# Patient Record
Sex: Male | Born: 1970 | Race: Black or African American | Hispanic: No | Marital: Married | Smoking: Current every day smoker
Health system: Southern US, Community
[De-identification: ages and names within clinical notes are randomized; demographics above are authoritative.]

## PROBLEM LIST (undated history)

## (undated) DIAGNOSIS — E669 Obesity, unspecified: Secondary | ICD-10-CM

## (undated) DIAGNOSIS — R6882 Decreased libido: Secondary | ICD-10-CM

## (undated) DIAGNOSIS — R011 Cardiac murmur, unspecified: Secondary | ICD-10-CM

## (undated) DIAGNOSIS — E119 Type 2 diabetes mellitus without complications: Secondary | ICD-10-CM

## (undated) DIAGNOSIS — G4730 Sleep apnea, unspecified: Secondary | ICD-10-CM

## (undated) HISTORY — DX: Decreased libido: R68.82

## (undated) HISTORY — DX: Cardiac murmur, unspecified: R01.1

## (undated) HISTORY — PX: CARPAL TUNNEL RELEASE: SHX101

## (undated) HISTORY — DX: Sleep apnea, unspecified: G47.30

## (undated) HISTORY — DX: Type 2 diabetes mellitus without complications: E11.9

## (undated) HISTORY — DX: Obesity, unspecified: E66.9

---

## 2005-06-28 ENCOUNTER — Ambulatory Visit: Payer: Self-pay | Admitting: Family Medicine

## 2008-10-14 ENCOUNTER — Ambulatory Visit: Payer: Self-pay | Admitting: Family Medicine

## 2013-04-30 ENCOUNTER — Ambulatory Visit: Payer: Self-pay | Admitting: Podiatry

## 2013-05-14 ENCOUNTER — Ambulatory Visit: Payer: Medicaid Other | Admitting: Podiatry

## 2013-06-25 ENCOUNTER — Ambulatory Visit: Payer: Self-pay | Admitting: Family Medicine

## 2014-09-16 ENCOUNTER — Ambulatory Visit (INDEPENDENT_AMBULATORY_CARE_PROVIDER_SITE_OTHER): Payer: BLUE CROSS/BLUE SHIELD | Admitting: Family Medicine

## 2014-09-16 ENCOUNTER — Encounter: Payer: Self-pay | Admitting: Family Medicine

## 2014-09-16 VITALS — BP 118/78 | HR 75 | Temp 98.6°F | Resp 16 | Ht 65.0 in | Wt 275.6 lb

## 2014-09-16 DIAGNOSIS — I10 Essential (primary) hypertension: Secondary | ICD-10-CM

## 2014-09-16 DIAGNOSIS — R51 Headache: Secondary | ICD-10-CM | POA: Diagnosis not present

## 2014-09-16 DIAGNOSIS — Z23 Encounter for immunization: Secondary | ICD-10-CM

## 2014-09-16 DIAGNOSIS — Z720 Tobacco use: Secondary | ICD-10-CM | POA: Diagnosis not present

## 2014-09-16 DIAGNOSIS — E669 Obesity, unspecified: Secondary | ICD-10-CM | POA: Diagnosis not present

## 2014-09-16 DIAGNOSIS — G5600 Carpal tunnel syndrome, unspecified upper limb: Secondary | ICD-10-CM

## 2014-09-16 DIAGNOSIS — R079 Chest pain, unspecified: Secondary | ICD-10-CM | POA: Diagnosis not present

## 2014-09-16 DIAGNOSIS — K219 Gastro-esophageal reflux disease without esophagitis: Secondary | ICD-10-CM

## 2014-09-16 DIAGNOSIS — R519 Headache, unspecified: Secondary | ICD-10-CM

## 2014-09-16 MED ORDER — OMEPRAZOLE 20 MG PO CPDR: 20.0000 mg | DELAYED_RELEASE_CAPSULE | Freq: Every day | ORAL | Status: DC

## 2014-09-16 NOTE — Progress Notes (Signed)
Name: Alvin Green   MRN: [id number]    DOB: [birth year]   Date:09/16/2014       Progress Note  Subjective  Chief Complaint  Chief Complaint  Patient presents with  . Chest Pain    sharpe chest pains for 2 days    HPI  Chest pain  Patient has a history of chest pain for the past 2 days. He describes it as being in both the left and right anterior chest area occasionally radiating to the neck to the left arm. He also has a left hand tingling which may be related to carpal tunnel syndrome as outlined below.  The patient is still an increased amount of stress of recent. This pain is described as a pressure sensation. There is also a bit of stress which is related to his work situation haven't let some employees ago that seems to be related to his onset of symptomatology.  Hypertension subjective patient has a [place]-standing history of hypertension and on the past and has been on a regimen of antihypertensive to given about a few months ago. Risk factors include hypertension as well as smoking obesity and relatively sedentary lifestyle  Carpal tunnel syndrome.  Complaining of tingling in the left hand and wrist which is often worse at night he has sometimes has to shake his hand to get it improved.  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 He recently has had to recommend several employees and let some go this is constant and more stress and more chest discomfort when he thinks about it.    Past Medical History  Diagnosis Date  . Decreased libido   . Obesity     Social History  Substance Use Topics  . Smoking status: Current Every Day Smoker  . Smokeless tobacco: Not on file  . Alcohol Use: 0.0 oz/week    0 Standard drinks or equivalent per week     Current outpatient prescriptions:  .  omeprazole (PRILOSEC) 20 MG capsule, Take 1 capsule (20 mg total) by mouth daily., Disp: 30 capsule, Rfl: 3  No Known Allergies  Review of  Systems  Constitutional: Negative for fever, chills and weight loss.  HENT: Negative for congestion, hearing loss, sore throat and tinnitus.   Eyes: Negative for blurred vision, double vision and redness.  Respiratory: Negative for cough, hemoptysis and shortness of breath.   Cardiovascular: Positive for chest pain. Negative for palpitations, orthopnea, claudication and leg swelling.  Gastrointestinal: Negative for heartburn, nausea, vomiting, diarrhea, constipation and blood in stool.  Genitourinary: Negative for dysuria, urgency, frequency and hematuria.  Musculoskeletal: Negative for myalgias, back pain, joint pain, falls and neck pain.  Skin: Negative for itching.  Neurological: Positive for tingling. Negative for dizziness, tremors, focal weakness, seizures, loss of consciousness, weakness and headaches.  Endo/Heme/Allergies: Does not bruise/bleed easily.  Psychiatric/Behavioral: Negative for depression and substance abuse. The patient is nervous/anxious. The patient does not have insomnia.      Objective  Filed Vitals:   09/16/14 0905  BP: 118/78  Pulse: 75  Temp: 98.6 F (37 C)  Resp: 16  Height:  (1.651 m)  Weight: 275 lb 9 oz (124.994 kg)  SpO2: 97%     Physical Exam  Constitutional: He is oriented to person, place, and time and well-developed, well-nourished, and in no distress.  HENT:  Head: Normocephalic.  Eyes: EOM are normal. Pupils are equal, round, and reactive to light.  Neck: Normal range of  motion. Neck supple. No thyromegaly present.  Cardiovascular: Normal rate, regular rhythm and normal heart sounds.   No murmur heard. Pulmonary/Chest: Effort normal and breath sounds normal. No respiratory distress. He has no wheezes.  Abdominal: Soft. Bowel sounds are normal.  Musculoskeletal: Normal range of motion. He exhibits no edema.  Lymphadenopathy:    He has no cervical adenopathy.  Neurological: He is alert and oriented to person, place, and time. No  cranial nerve deficit. Gait normal. Coordination normal.  Positive Phalen's and Tinel's test the left wrist  Skin: Skin is warm and dry. No rash noted.  Psychiatric: Affect and judgment normal.  Vitals reviewed.     Assessment & Plan  1. Chest pain, unspecified chest pain type Rule out cardiac origin in view of risk factors which include hypertension obesity and tobacco abuse - EKG 12-Lead - Comprehensive Metabolic Panel (CMET) - Lipid Profile - TSH - Ambulatory referral to Cardiology  2. Essential hypertension doing well off medication. Reassess in 1 month   3. Obesity Encourage diet and exercise  4. Headache, unspecified headache type Reassess in 1 month to see this related to his hypertension  5. Carpal tunnel syndrome, unspecified laterality Wrist splint and check glucose - Wrist splint - POCT Glucose (CBG)  6. Tobacco abuse Encourage discontinuation  7. Gastroesophageal reflux disease without esophagitis Trial of omeprazole - omeprazole (PRILOSEC) 20 MG capsule; Take 1 capsule (20 mg total) by mouth daily.  Dispense: 30 capsule; Refill: 3

## 2014-09-28 ENCOUNTER — Encounter: Payer: Self-pay | Admitting: Cardiovascular Disease

## 2014-09-28 ENCOUNTER — Ambulatory Visit: Payer: Self-pay | Admitting: Cardiovascular Disease

## 2014-09-28 ENCOUNTER — Ambulatory Visit (INDEPENDENT_AMBULATORY_CARE_PROVIDER_SITE_OTHER): Payer: BLUE CROSS/BLUE SHIELD | Admitting: Cardiovascular Disease

## 2014-09-28 DIAGNOSIS — R0602 Shortness of breath: Secondary | ICD-10-CM | POA: Diagnosis not present

## 2014-09-28 DIAGNOSIS — R079 Chest pain, unspecified: Secondary | ICD-10-CM | POA: Insufficient documentation

## 2014-09-28 MED ORDER — ASPIRIN EC 81 MG PO TBEC: 81.0000 mg | DELAYED_RELEASE_TABLET | Freq: Every day | ORAL | Status: DC

## 2014-09-28 MED ORDER — SILDENAFIL CITRATE 50 MG PO TABS: 50.0000 mg | ORAL_TABLET | Freq: Every day | ORAL | Status: DC | PRN

## 2014-09-28 NOTE — Assessment & Plan Note (Signed)
He has a significant component of shortness of breath and thus I requested an echocardiogram to evaluate diastolic function and pulmonary pressure.

## 2014-09-28 NOTE — Progress Notes (Signed)
Primary care physician: Dr. Thana Ates  HPI  This is a pleasant 44 year old African-American male who was referred for evaluation of exertional chest pain and shortness of breath. He has no previous cardiac history. He reports history of elevated blood pressure in the past but currently not requiring medications. He is morbidly obese and smokes one third of a pack daily. He drinks occasional alcohol and denies any recreational drug use. He played football in high school. He has no family history of coronary artery disease. He works as a Production designer, theatre/television/film at [employer]. Over the last few months, he has experienced intermittent episodes of substernal chest tightness with extreme shortness of breath. This started in the summer and has continued. The symptoms have been stable over the last few months. The tightness lasts for a few minutes or until he rests. The chest tightness can also be triggered by stress or if he gets upset.   No Known Allergies   Current Outpatient Prescriptions on File Prior to Visit  Medication Sig Dispense Refill  . omeprazole (PRILOSEC) 20 MG capsule Take 1 capsule (20 mg total) by mouth daily. 30 capsule 3   No current facility-administered medications on file prior to visit.     Past Medical History  Diagnosis Date  . Decreased libido   . Obesity   . Heart murmur      Past Surgical History  Procedure Laterality Date  . Carpal tunnel release       Family History  Problem Relation Age of Onset  . Hypertension Mother   . Sleep disorder Father      Social History   Social History  . Marital Status: Unknown    Spouse Name: N/A  . Number of Children: N/A  . Years of Education: N/A   Occupational History  . Not on file.   Social History Main Topics  . Smoking status: Current Every Day Smoker -- 0.50 packs/day for 9 years    Types: Cigarettes  . Smokeless tobacco: Not on file  . Alcohol Use: 0.0 oz/week    0 Standard drinks or equivalent per week  . Drug  Use: No  . Sexual Activity: Not on file   Other Topics Concern  . Not on file   Social History Narrative     ROS A 10 point review of system was performed. It is negative other than that mentioned in the history of present illness.   PHYSICAL EXAM   BP 120/60 mmHg  Pulse 61  Ht  (1.676 m)  Wt 276 lb (125.193 kg)  BMI 44.57 kg/m2 Constitutional: He is oriented to person, place, and time. He appears well-developed and well-nourished. No distress.  HENT: No nasal discharge.  Head: Normocephalic and atraumatic.  Eyes: Pupils are equal and round.  No discharge. Neck: Normal range of motion. Neck supple. No JVD present. No thyromegaly present.  Cardiovascular: Normal rate, regular rhythm, normal heart sounds. Exam reveals no gallop and no friction rub. No murmur heard.  Pulmonary/Chest: Effort normal and breath sounds normal. No stridor. No respiratory distress. He has no wheezes. He has no rales. He exhibits no tenderness.  Abdominal: Soft. Bowel sounds are normal. He exhibits no distension. There is no tenderness. There is no rebound and no guarding.  Musculoskeletal: Normal range of motion. He exhibits no edema and no tenderness.  Neurological: He is alert and oriented to person, place, and time. Coordination normal.  Skin: Skin is warm and dry. No rash noted. He is not diaphoretic.  No erythema. No pallor.  Psychiatric: He has a normal mood and affect. His behavior is normal. Judgment and thought content normal.       ZOX:WRUEAV sinus rhythm P:QRS - 1:1, Abnormal P axis, H Rate 61 -  Nonspecific inferior T-abnormality.   ABNORMAL    ASSESSMENT AND PLAN

## 2014-09-28 NOTE — Patient Instructions (Addendum)
Medication Instructions:  Your physician has recommended you make the following change in your medication:  START taking aspirin  once per day   Labwork: none  Testing/Procedures: Your physician has requested that you have an echocardiogram. Echocardiography is a painless test that uses sound waves to create images of your heart. It provides your doctor with information about the size and shape of your heart and how well your heart's chambers and valves are working. This procedure takes approximately one hour. There are no restrictions for this procedure.  Your physician has requested that you have a lexiscan myoview. For further information please visit [email or web address] Please follow instruction sheet, as given.  [facility] MYOVIEW  Your caregiver has ordered a Stress Test with nuclear imaging. The purpose of this test is to evaluate the blood supply to your heart muscle. This procedure is referred to as a "Non-Invasive Stress Test." This is because other than having an IV started in your vein, nothing is inserted or "invades" your body. Cardiac stress tests are done to find areas of poor blood flow to the heart by determining the extent of coronary artery disease (CAD). Some patients exercise on a treadmill, which naturally increases the blood flow to your heart, while others who are  unable to walk on a treadmill due to physical limitations have a pharmacologic/chemical stress agent called Lexiscan . This medicine will mimic walking on a treadmill by temporarily increasing your coronary blood flow.   Please note: these test may take anywhere between 2-4 hours to complete  PLEASE REPORT TO [facility] ENTRANCE  THE VOLUNTEERS AT THE FIRST DESK WILL DIRECT YOU WHERE TO GO  Date of Procedure: Friday, September 30, 8:30am  Arrival Time for Procedure: 8:00am  Instructions regarding medication:   You may take your morning medications with a sip of water.    PLEASE NOTIFY THE  OFFICE AT LEAST 24 HOURS IN ADVANCE IF YOU ARE UNABLE TO KEEP YOUR APPOINTMENT.  [phone] AND  PLEASE NOTIFY NUCLEAR MEDICINE AT [facility] AT LEAST 24 HOURS IN ADVANCE IF YOU ARE UNABLE TO KEEP YOUR APPOINTMENT. [phone]  How to prepare for your Myoview test:  1. Do not eat or drink after midnight 2. No caffeine for 24 hours prior to test 3. No smoking 24 hours prior to test. 4. Your medication may be taken with water.  If your doctor stopped a medication because of this test, do not take that medication. 5. Ladies, please do not wear dresses.  Skirts or pants are appropriate. Please wear a short sleeve shirt. 6. No perfume, cologne or lotion. 7. Wear comfortable walking shoes. No heels!             Follow-Up: Your physician recommends that you schedule a follow-up appointment with Dr. Kirke Corin after tests  Any Other Special Instructions Will Be Listed Below (If Applicable).  Echocardiogram An echocardiogram, or echocardiography, uses sound waves (ultrasound) to produce an image of your heart. The echocardiogram is simple, painless, obtained within a short period of time, and offers valuable information to your health care Lauria Depoy. The images from an echocardiogram can provide information such as:  Evidence of coronary artery disease (CAD).  Heart size.  Heart muscle function.  Heart valve function.  Aneurysm detection.  Evidence of a past heart attack.  Fluid buildup around the heart.  Heart muscle thickening.  Assess heart valve function. LET [facility] CARE Kelon Easom KNOW ABOUT:  Any allergies you have.  All medicines you are taking, including vitamins,  herbs, eye drops, creams, and over-the-counter medicines.  Previous problems you or members of your family have had with the use of anesthetics.  Any blood disorders you have.  Previous surgeries you have had.  Medical conditions you have.  Possibility of pregnancy, if this applies. BEFORE THE  PROCEDURE  No special preparation is needed. Eat and drink normally.  PROCEDURE  8. In order to produce an image of your heart, gel will be applied to your chest and a wand-like tool (transducer) will be moved over your chest. The gel will help transmit the sound waves from the transducer. The sound waves will harmlessly bounce off your heart to allow the heart images to be captured in real-time motion. These images will then be recorded. 9. You may need an IV to receive a medicine that improves the quality of the pictures. AFTER THE PROCEDURE You may return to your normal schedule including diet, activities, and medicines, unless your health care Cristella Stiver tells you otherwise. Document Released: 12/17/1999 Document Revised: 05/05/2013 Document Reviewed: 08/26/2012 [facility] Patient Information 2015 [place], [place]. This information is not intended to replace advice given to you by your health care Rilyn Upshaw. Make sure you discuss any questions you have with your health care Reubin Bushnell.

## 2014-09-28 NOTE — Assessment & Plan Note (Signed)
The patient's symptoms are suggestive of class III angina with reported exertional chest tightness. However, there is also possibility of physical deconditioning. His EKG shows inverted T waves in the inferior leads suggestive of possible ischemia. I asked him to start aspirin 81 mg daily. I requested a treadmill nuclear stress test. Given that his baseline ECG is abnormal, I do not think a treadmill stress test alone is sufficient. I asked him to limit his exertional physical activities until after cardiac workup.

## 2014-09-28 NOTE — Assessment & Plan Note (Signed)
I had a prolonged discussion with the patient about the importance of healthy lifestyle changes. If his cardiac workup is unremarkable, then I will recommend an exercise program, healthy diet and weight loss.

## 2014-10-01 ENCOUNTER — Telehealth: Payer: Self-pay

## 2014-10-01 NOTE — Telephone Encounter (Signed)
Reviewed lexi myoview instructions w/pt. Confirmed echo and f/u appt  Pt verbalized understanding with no further questions.

## 2014-10-02 ENCOUNTER — Ambulatory Visit
Admission: RE | Admit: 2014-10-02 | Discharge: 2014-10-02 | Disposition: A | Discharge status: Home / Self Care | Payer: BLUE CROSS/BLUE SHIELD | Source: Ambulatory Visit | Attending: Cardiovascular Disease | Admitting: Cardiovascular Disease

## 2014-10-02 DIAGNOSIS — R079 Chest pain, unspecified: Secondary | ICD-10-CM | POA: Diagnosis not present

## 2014-10-02 LAB — NM MYOCAR MULTI W/SPECT W/WALL MOTION / EF
CHL CUP NUCLEAR SDS: 0
CHL CUP NUCLEAR SSS: 0
CSEPHR: 85 %
CSEPPHR: 151 {beats}/min
LV sys vol: 46 mL
LVDIAVOL: 105 mL
Rest HR: 90 {beats}/min
SRS: 0
TID: 0.78

## 2014-10-02 MED ORDER — TECHNETIUM TC 99M SESTAMIBI - CARDIOLITE
13.0000 | Freq: Once | INTRAVENOUS | Status: AC | PRN
  Administered 2014-10-02: 09:00:00 13.396 via INTRAVENOUS

## 2014-10-02 MED ORDER — TECHNETIUM TC 99M SESTAMIBI - CARDIOLITE
32.3000 | Freq: Once | INTRAVENOUS | Status: AC | PRN
  Administered 2014-10-02: 10:00:00 32.3 via INTRAVENOUS

## 2014-10-06 ENCOUNTER — Other Ambulatory Visit: Payer: Self-pay

## 2014-10-06 ENCOUNTER — Ambulatory Visit (INDEPENDENT_AMBULATORY_CARE_PROVIDER_SITE_OTHER): Payer: BLUE CROSS/BLUE SHIELD

## 2014-10-06 DIAGNOSIS — R0602 Shortness of breath: Secondary | ICD-10-CM

## 2014-10-20 ENCOUNTER — Ambulatory Visit: Payer: BLUE CROSS/BLUE SHIELD | Admitting: Family Medicine

## 2014-10-20 ENCOUNTER — Encounter: Payer: BLUE CROSS/BLUE SHIELD | Admitting: Family Medicine

## 2014-10-22 ENCOUNTER — Ambulatory Visit: Payer: BLUE CROSS/BLUE SHIELD | Admitting: Family Medicine

## 2014-11-13 ENCOUNTER — Ambulatory Visit: Payer: BLUE CROSS/BLUE SHIELD | Admitting: Cardiovascular Disease

## 2014-11-17 ENCOUNTER — Encounter: Payer: BLUE CROSS/BLUE SHIELD | Admitting: Family Medicine

## 2014-11-20 ENCOUNTER — Ambulatory Visit: Payer: BLUE CROSS/BLUE SHIELD | Admitting: Cardiovascular Disease

## 2015-01-28 ENCOUNTER — Ambulatory Visit: Payer: BLUE CROSS/BLUE SHIELD | Admitting: Cardiovascular Disease

## 2015-09-30 ENCOUNTER — Emergency Department
Admission: EM | Admit: 2015-09-30 | Discharge: 2015-09-30 | Disposition: A | Discharge status: Home / Self Care | Payer: BLUE CROSS/BLUE SHIELD | Attending: Emergency Medicine | Admitting: Emergency Medicine

## 2015-09-30 ENCOUNTER — Emergency Department: Payer: BLUE CROSS/BLUE SHIELD

## 2015-09-30 ENCOUNTER — Encounter: Payer: Self-pay | Admitting: *Deleted

## 2015-09-30 DIAGNOSIS — Z79899 Other long term (current) drug therapy: Secondary | ICD-10-CM | POA: Diagnosis not present

## 2015-09-30 DIAGNOSIS — F17210 Nicotine dependence, cigarettes, uncomplicated: Secondary | ICD-10-CM | POA: Insufficient documentation

## 2015-09-30 DIAGNOSIS — M1711 Unilateral primary osteoarthritis, right knee: Secondary | ICD-10-CM | POA: Diagnosis not present

## 2015-09-30 DIAGNOSIS — M25561 Pain in right knee: Secondary | ICD-10-CM | POA: Diagnosis present

## 2015-09-30 DIAGNOSIS — Z7982 Long term (current) use of aspirin: Secondary | ICD-10-CM | POA: Diagnosis not present

## 2015-09-30 MED ORDER — MELOXICAM 15 MG PO TABS: 15.0000 mg | ORAL_TABLET | Freq: Every day | ORAL | 2 refills | Status: DC

## 2015-09-30 NOTE — Discharge Instructions (Signed)
Begin taking meloxicam 15 mg once daily. Be sure to take this medication with food. If any continued problems with her knee follow-up with your primary care doctor or Dr. Hyacinth MeekerMiller who is the orthopedist on call today.

## 2015-09-30 NOTE — ED Triage Notes (Signed)
Pt complains of right knee pain, pt denies injuring knee, pt denies any other symptoms

## 2015-09-30 NOTE — ED Notes (Signed)
Patient discharged home.

## 2015-09-30 NOTE — ED Provider Notes (Signed)
[facility] Emergency Department Provider Note   ____________________________________________   First MD Initiated Contact with Patient 09/30/15 1008     (approximate)  I have reviewed the triage vital signs and the nursing notes.   HISTORY  Chief Complaint Knee Pain    HPI Alvin Green is a 45 y.o. male is here complaining of right knee pain. Pain is anterior medial aspect of his right knee. Pain is worse with movement and standing. Patient is unaware of any injury to his knee. He states that his knees been hurting for the last week and does not seem to be improving. The patient has not consistently taken any over-the-counter medication for his pain. He denies any previous problems with his knee. Currently he rates his knee pain is 8 out of 10.   Past Medical History:  Diagnosis Date  . Decreased libido   . Heart murmur   . Obesity     Patient Active Problem List   Diagnosis Date Noted  . Pain in the chest 09/28/2014  . SOB (shortness of breath) 09/28/2014  . Morbid obesity (HCC) 09/28/2014    Past Surgical History:  Procedure Laterality Date  . CARPAL TUNNEL RELEASE    . CARPAL TUNNEL RELEASE Right    approximately 10 years ago    Prior to Admission medications   Medication Sig Start Date End Date Taking? Authorizing Provider  aspirin EC 81 MG tablet Take 1 tablet (81 mg total) by mouth daily. 09/28/14   Iran Ouch, MD  meloxicam (MOBIC) 15 MG tablet Take 1 tablet (15 mg total) by mouth daily. 09/30/15 09/29/16  Tommi Rumps, PA-C  omeprazole (PRILOSEC) 20 MG capsule Take 1 capsule (20 mg total) by mouth daily. 09/16/14   Dennison Mascot, MD    Allergies Review of patient's allergies indicates no known allergies.  Family History  Problem Relation Age of Onset  . Hypertension Mother   . Sleep disorder Father     Social History Social History  Substance Use Topics  . Smoking status: Current Every Day Smoker   Packs/day: 0.50    Years: 9.00    Types: Cigarettes  . Smokeless tobacco: Not on file  . Alcohol use 0.0 oz/week    Review of Systems Constitutional: No fever/chills Cardiovascular: Denies chest pain. Respiratory: Denies shortness of breath. Gastrointestinal:   No nausea, no vomiting.  Musculoskeletal: Negative for back pain. Positive right knee pain. Skin: Negative for rash. Neurological: Negative for headaches, focal weakness or numbness.  10-point ROS otherwise negative.  ____________________________________________   PHYSICAL EXAM:  VITAL SIGNS: ED Triage Vitals  Enc Vitals Group     BP 09/30/15 1001 139/69     Pulse Rate 09/30/15 1001 90     Resp 09/30/15 1001 20     Temp 09/30/15 1001 98.5 F (36.9 C)     Temp Source 09/30/15 1001 Oral     SpO2 09/30/15 1001 100 %     Weight 09/30/15 1001 278 lb (126.1 kg)     Height 09/30/15 1001 5\' 6"  (1.676 m)     Head Circumference --      Peak Flow --      Pain Score 09/30/15 1002 8     Pain Loc --      Pain Edu? --      Excl. in GC? --     Constitutional: Alert and oriented. Well appearing and in no acute distress. Eyes: Conjunctivae are normal. PERRL. EOMI. Head:  Atraumatic. Nose: No congestion/rhinnorhea. Neck: No stridor.   Cardiovascular: Normal rate, regular rhythm. Grossly normal heart sounds.  Good peripheral circulation. Respiratory: Normal respiratory effort.  No retractions. Lungs CTAB. Musculoskeletal: Examination of the right knee there is no gross deformity. Knee has the appearance of some degenerative changes. There is no evidence of effusion. There is some tenderness on palpation of the medial aspect. Range of motion is slightly guarded secondary to pain. Ligaments are stable bilaterally. Neurologic:  Normal speech and language. No gross focal neurologic deficits are appreciated. No gait instability. Skin:  Skin is warm, dry and intact. No rash noted. No warmth, redness, erythema or abrasions were  noted. Psychiatric: Mood and affect are normal. Speech and behavior are normal.  ____________________________________________   LABS (all labs ordered are listed, but only abnormal results are displayed)  Labs Reviewed - No data to display  RADIOLOGY  X-ray right knee per radiologist IMPRESSION:  1. No acute findings of the knee.  2. Mild degenerate spurring.  I, Tommi Rumpshonda L Robyne Matar, personally viewed and evaluated these images (plain radiographs) as part of my medical decision making, as well as reviewing the written report by the radiologist.  ____________________________________________   PROCEDURES  Procedure(s) performed: None  Procedures  Critical Care performed: No  ____________________________________________   INITIAL IMPRESSION / ASSESSMENT AND PLAN / ED COURSE  Pertinent labs & imaging results that were available during my care of the patient were reviewed by me and considered in my medical decision making (see chart for details).    Clinical Course   Patient was started on meloxicam 15 mg one daily with food. Patient is follow-up with his primary care doctor or Dr. Hyacinth MeekerMiller if any continued problems with his knee.   ____________________________________________   FINAL CLINICAL IMPRESSION(S) / ED DIAGNOSES  Final diagnoses:  Osteoarthritis of right knee, unspecified osteoarthritis type      NEW MEDICATIONS STARTED DURING THIS VISIT:  Discharge Medication List as of 09/30/2015 11:15 AM    START taking these medications   Details  meloxicam (MOBIC) 15 MG tablet Take 1 tablet (15 mg total) by mouth daily., Starting Thu 09/30/2015, Until Fri 09/29/2016, Print         Note:  This document was prepared using Dragon voice recognition software and may include unintentional dictation errors.    Tommi Rumpshonda L Rahim Astorga, PA-C 09/30/15 1241    Tommi Rumpshonda L Mohamed Portlock, PA-C 09/30/15 1302    Jennye MoccasinBrian S Quigley, MD 09/30/15 [phone]

## 2016-02-18 ENCOUNTER — Encounter: Payer: Self-pay | Admitting: Emergency Medicine

## 2016-02-18 ENCOUNTER — Emergency Department
Admission: EM | Admit: 2016-02-18 | Discharge: 2016-02-18 | Disposition: A | Discharge status: Home / Self Care | Payer: BLUE CROSS/BLUE SHIELD | Attending: Student in an Organized Health Care Education/Training Program | Admitting: Student in an Organized Health Care Education/Training Program

## 2016-02-18 DIAGNOSIS — Z7982 Long term (current) use of aspirin: Secondary | ICD-10-CM | POA: Insufficient documentation

## 2016-02-18 DIAGNOSIS — M546 Pain in thoracic spine: Secondary | ICD-10-CM | POA: Diagnosis not present

## 2016-02-18 DIAGNOSIS — M25512 Pain in left shoulder: Secondary | ICD-10-CM | POA: Insufficient documentation

## 2016-02-18 DIAGNOSIS — Y92410 Unspecified street and highway as the place of occurrence of the external cause: Secondary | ICD-10-CM | POA: Insufficient documentation

## 2016-02-18 DIAGNOSIS — Y999 Unspecified external cause status: Secondary | ICD-10-CM | POA: Diagnosis not present

## 2016-02-18 DIAGNOSIS — F17210 Nicotine dependence, cigarettes, uncomplicated: Secondary | ICD-10-CM | POA: Insufficient documentation

## 2016-02-18 DIAGNOSIS — M7918 Myalgia, other site: Secondary | ICD-10-CM

## 2016-02-18 DIAGNOSIS — Y9389 Activity, other specified: Secondary | ICD-10-CM | POA: Diagnosis not present

## 2016-02-18 DIAGNOSIS — M542 Cervicalgia: Secondary | ICD-10-CM | POA: Diagnosis not present

## 2016-02-18 DIAGNOSIS — S199XXA Unspecified injury of neck, initial encounter: Secondary | ICD-10-CM | POA: Diagnosis present

## 2016-02-18 MED ORDER — CYCLOBENZAPRINE HCL 10 MG PO TABS: 10.0000 mg | ORAL_TABLET | Freq: Three times a day (TID) | ORAL | 0 refills | Status: DC | PRN

## 2016-02-18 MED ORDER — NAPROXEN 500 MG PO TABS: 500.0000 mg | ORAL_TABLET | Freq: Two times a day (BID) | ORAL | 2 refills | Status: DC
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
## 2016-04-18 ENCOUNTER — Ambulatory Visit: Payer: BLUE CROSS/BLUE SHIELD | Admitting: Family Medicine

## 2016-04-28 ENCOUNTER — Encounter: Payer: Self-pay | Admitting: Family Medicine

## 2016-04-28 ENCOUNTER — Ambulatory Visit (INDEPENDENT_AMBULATORY_CARE_PROVIDER_SITE_OTHER): Payer: BLUE CROSS/BLUE SHIELD | Admitting: Family Medicine

## 2016-04-28 VITALS — BP 130/78 | HR 119 | Temp 98.7°F | Resp 18 | Ht 66.0 in | Wt 257.4 lb

## 2016-04-28 DIAGNOSIS — E119 Type 2 diabetes mellitus without complications: Secondary | ICD-10-CM | POA: Insufficient documentation

## 2016-04-28 DIAGNOSIS — E1165 Type 2 diabetes mellitus with hyperglycemia: Secondary | ICD-10-CM | POA: Diagnosis not present

## 2016-04-28 DIAGNOSIS — R358 Other polyuria: Secondary | ICD-10-CM

## 2016-04-28 DIAGNOSIS — R350 Frequency of micturition: Secondary | ICD-10-CM

## 2016-04-28 DIAGNOSIS — IMO0001 Reserved for inherently not codable concepts without codable children: Secondary | ICD-10-CM

## 2016-04-28 HISTORY — DX: Type 2 diabetes mellitus without complications: E11.9

## 2016-04-28 LAB — CBC WITH DIFFERENTIAL/PLATELET
BASOS ABS: 0 {cells}/uL (ref 0–200)
BASOS PCT: 0 %
EOS ABS: 164 {cells}/uL (ref 15–500)
Eosinophils Relative: 2 %
HEMATOCRIT: 43.9 % (ref 38.5–50.0)
HEMOGLOBIN: 14.3 g/dL (ref 13.2–17.1)
LYMPHS ABS: 2378 {cells}/uL (ref 850–3900)
Lymphocytes Relative: 29 %
MCH: 28.9 pg (ref 27.0–33.0)
MCHC: 32.6 g/dL (ref 32.0–36.0)
MCV: 88.7 fL (ref 80.0–100.0)
MONO ABS: 738 {cells}/uL (ref 200–950)
MPV: 12.6 fL — AB (ref 7.5–12.5)
Monocytes Relative: 9 %
NEUTROS ABS: 4920 {cells}/uL (ref 1500–7800)
Neutrophils Relative %: 60 %
PLATELETS: 195 10*3/uL (ref 140–400)
RBC: 4.95 MIL/uL (ref 4.20–5.80)
RDW: 12.2 % (ref 11.0–15.0)
WBC: 8.2 10*3/uL (ref 3.8–10.8)

## 2016-04-28 LAB — POCT GLYCOSYLATED HEMOGLOBIN (HGB A1C): HEMOGLOBIN A1C: 12.7

## 2016-04-28 MED ORDER — INSULIN GLARGINE 100 UNIT/ML SOLOSTAR PEN: 10.0000 [IU] | PEN_INJECTOR | Freq: Every day | SUBCUTANEOUS | 2 refills | Status: DC

## 2016-04-28 MED ORDER — METFORMIN HCL 500 MG PO TABS: 500.0000 mg | ORAL_TABLET | Freq: Two times a day (BID) | ORAL | 0 refills | Status: DC

## 2016-04-28 MED ORDER — INSULIN PEN NEEDLE 32G X 6 MM MISC: 1.0000 | Freq: Three times a day (TID) | 0 refills | Status: DC
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
## 2016-04-29 LAB — URINALYSIS, ROUTINE W REFLEX MICROSCOPIC
Bilirubin Urine: NEGATIVE
Hgb urine dipstick: NEGATIVE
LEUKOCYTES UA: NEGATIVE
NITRITE: NEGATIVE
PH: 5.5 (ref 5.0–8.0)
SPECIFIC GRAVITY, URINE: 1.044 — AB (ref 1.001–1.035)

## 2016-04-29 LAB — COMPLETE METABOLIC PANEL WITH GFR
AG RATIO: 1.6 ratio (ref 1.0–2.5)
ALBUMIN: 4.2 g/dL (ref 3.6–5.1)
ALT: 43 U/L (ref 9–46)
AST: 31 U/L (ref 10–40)
Alkaline Phosphatase: 91 U/L (ref 40–115)
BILIRUBIN TOTAL: 0.5 mg/dL (ref 0.2–1.2)
BUN/Creatinine Ratio: 13.1 Ratio (ref 6–22)
BUN: 11 mg/dL (ref 7–25)
CALCIUM: 9.4 mg/dL (ref 8.6–10.3)
CO2: 22 mmol/L (ref 20–31)
Chloride: 102 mmol/L (ref 98–110)
Creat: 0.84 mg/dL (ref 0.60–1.35)
GFR, Est African American: >89 mL/min (ref >=60)
GLOBULIN: 2.6 g/dL (ref 1.9–3.7)
GLUCOSE: 331 mg/dL — AB (ref 65–99)
Potassium: 4.5 mmol/L (ref 3.5–5.3)
Sodium: 136 mmol/L (ref 135–146)
TOTAL PROTEIN: 6.8 g/dL (ref 6.1–8.1)

## 2016-04-29 LAB — URINALYSIS, MICROSCOPIC ONLY
Bacteria, UA: NONE SEEN [HPF]
CASTS: NONE SEEN [LPF]
Crystals: NONE SEEN [HPF]
RBC / HPF: NONE SEEN RBC/HPF (ref <=2)
Squamous Epithelial / LPF: NONE SEEN [HPF] (ref <=5)
Yeast: NONE SEEN [HPF]

## 2016-04-29 LAB — OSMOLALITY, URINE: Osmolality, Ur: 840 mOsm/kg (ref 50–1200)

## 2016-04-29 LAB — TSH: TSH: 2.03 mIU/L (ref 0.40–4.50)

## 2016-05-30 ENCOUNTER — Ambulatory Visit (INDEPENDENT_AMBULATORY_CARE_PROVIDER_SITE_OTHER): Payer: BLUE CROSS/BLUE SHIELD | Admitting: Family Medicine

## 2016-05-30 ENCOUNTER — Encounter: Payer: Self-pay | Admitting: Family Medicine

## 2016-05-30 VITALS — BP 131/78 | HR 87 | Temp 97.9°F | Resp 16 | Ht 66.0 in | Wt 251.4 lb

## 2016-05-30 DIAGNOSIS — E1165 Type 2 diabetes mellitus with hyperglycemia: Secondary | ICD-10-CM | POA: Diagnosis not present

## 2016-05-30 DIAGNOSIS — Z716 Tobacco abuse counseling: Secondary | ICD-10-CM | POA: Diagnosis not present

## 2016-05-30 DIAGNOSIS — IMO0001 Reserved for inherently not codable concepts without codable children: Secondary | ICD-10-CM

## 2016-05-30 LAB — LIPID PANEL
Cholesterol: 153 mg/dL (ref <200)
HDL: 59 mg/dL (ref >40)
LDL Cholesterol: 76 mg/dL (ref <100)
TRIGLYCERIDES: 92 mg/dL (ref <150)
Total CHOL/HDL Ratio: 2.6 Ratio (ref <5.0)
VLDL: 18 mg/dL (ref <30)

## 2016-05-30 LAB — POCT CBG (FASTING - GLUCOSE)-MANUAL ENTRY: GLUCOSE FASTING, POC: 95 mg/dL (ref 70–99)

## 2016-05-30 MED ORDER — METFORMIN HCL 1000 MG PO TABS: 1000.0000 mg | ORAL_TABLET | Freq: Two times a day (BID) | ORAL | 0 refills | Status: DC

## 2016-05-30 NOTE — Progress Notes (Signed)
Name: Alvin Green   MRN: [id number]    DOB: 1970-12-22   Date:05/30/2016       Progress Note  Subjective  Chief Complaint  Chief Complaint  Patient presents with  . Follow-up    1 mo  . Medication Refill    Diabetes  He presents for his follow-up diabetic visit. He has type 2 diabetes mellitus. His disease course has been improving. Pertinent negatives for diabetes include no blurred vision, no fatigue, no foot paresthesias, no polydipsia and no polyuria. Current diabetic treatment includes diet, intensive insulin program and oral agent (monotherapy). He is following a diabetic (changed dietary habits.) diet. He rarely participates in exercise. His breakfast blood glucose range is generally 130-140 mg/dl.  Nicotine Dependence  Presents for initial visit. Symptoms are negative for fatigue. (More of a habit than cravings. ) Preferred tobacco types include cigarettes. Preferred cigarette types include filtered. Preferred strength is regular. Preferred cigarettes are menthol. Preferred brands include [place]. His urge triggers include company of smokers, driving and meal time. Risk factors do not include stress.His first smoke is from 6 to 8 AM. He smokes < 1/2 a pack of cigarettes per day. He started smoking when he was >46 years old. Past treatments include nothing. Alvin Green has tried to quit 1 (stopped for 2 years) time.      Past Medical History:  Diagnosis Date  . Decreased libido   . Heart murmur   . Obesity   . Sleep apnea in adult     Past Surgical History:  Procedure Laterality Date  . CARPAL TUNNEL RELEASE    . CARPAL TUNNEL RELEASE Right    approximately 10 years ago    Family History  Problem Relation Age of Onset  . Hypertension Mother   . Sleep disorder Father   . Hypertension Brother     Social History   Social History  . Marital status: Married    Spouse name: N/A  . Number of children: N/A  . Years of education: N/A   Occupational History  .  Not on file.   Social History Main Topics  . Smoking status: Current Every Day Smoker    Packs/day: 0.30    Years: 9.00    Types: Cigarettes  . Smokeless tobacco: Never Used  . Alcohol use 0.0 oz/week     Comment: occasionally  . Drug use: No  . Sexual activity: Yes    Partners: Female    Birth control/ protection: None   Other Topics Concern  . Not on file   Social History Narrative  . No narrative on file     Current Outpatient Prescriptions:  .  aspirin EC 81 MG tablet, Take 1 tablet (81 mg total) by mouth daily., Disp: 90 tablet, Rfl: 0 .  Insulin Glargine (LANTUS) 100 UNIT/ML Solostar Pen, Inject 10 Units into the skin daily at 10 pm., Disp: 15 mL, Rfl: 2 .  Insulin Pen Needle 32G X 6 MM MISC, 1 each by Does not apply route 3 (three) times daily., Disp: 100 each, Rfl: 0 .  metFORMIN (GLUCOPHAGE) 500 MG tablet, Take 1 tablet (500 mg total) by mouth 2 (two) times daily with a meal., Disp: 60 tablet, Rfl: 0  No Known Allergies   Review of Systems  Constitutional: Negative for fatigue.  Eyes: Negative for blurred vision.  Endo/Heme/Allergies: Negative for polydipsia.      Objective  Vitals:   05/30/16 1003  BP: 131/78  Pulse: 87  Resp: 16  Temp: 97.9 F (36.6 C)  TempSrc: Oral  SpO2: 96%  Weight: 251 lb 6.4 oz (114 kg)  Height: 5\' 6"  (1.676 m)    Physical Exam  Constitutional: He is oriented to person, place, and time and well-developed, well-nourished, and in no distress.  HENT:  Head: Normocephalic and atraumatic.  Cardiovascular: Normal rate, regular rhythm and normal heart sounds.   No murmur heard. Pulmonary/Chest: Effort normal and breath sounds normal. He has no wheezes.  Abdominal: Soft. Bowel sounds are normal. There is no tenderness.  Musculoskeletal: He exhibits no edema.  Neurological: He is alert and oriented to person, place, and time.  Psychiatric: Mood, memory, affect and judgment normal.  Nursing note and vitals  reviewed.    Assessment & Plan  1. Uncontrolled type 2 diabetes mellitus without complication, without [place]-term current use of insulin (HCC)   Glucose is 95 MG per DL, increase metformin to 1000 mg twice a day, recheck A1c in 2 months - metFORMIN (GLUCOPHAGE) 1000 MG tablet; Take 1 tablet (1,000 mg total) by mouth 2 (two) times daily with a meal.  Dispense: 180 tablet; Refill: 0 - Lipid panel - Urine Microalbumin w/creat. ratio - Ambulatory referral to Ophthalmology - POCT CBG (Fasting - Glucose)  2. Tobacco abuse counseling Patient was educated on the dangers of cigarette smoking, he is not ready to quit but is going to think about cessation and will readdress at next visit   [name] Asad A. [facility] 05/30/2016 10:12 AM

## 2016-05-31 LAB — MICROALBUMIN / CREATININE URINE RATIO
CREATININE, URINE: 121 mg/dL (ref 20–370)
Microalb Creat Ratio: 30 mcg/mg creat — ABNORMAL HIGH (ref <30)
Microalb, Ur: 3.6 mg/dL

## 2016-06-07 ENCOUNTER — Telehealth: Payer: Self-pay

## 2016-06-07 MED ORDER — ROSUVASTATIN CALCIUM 5 MG PO TABS: 5.0000 mg | ORAL_TABLET | Freq: Every day | ORAL | 0 refills | Status: DC

## 2016-06-07 NOTE — Telephone Encounter (Signed)
Patient has been notified of lab results and a prescription for rosuvastatin 5 mg by mouth daily at bedtime #[facility] per Dr. Sherryll BurgerShah, patient has been notified

## 2016-07-07 ENCOUNTER — Encounter: Payer: BLUE CROSS/BLUE SHIELD | Admitting: Family Medicine

## 2016-09-07 ENCOUNTER — Encounter: Payer: Self-pay | Admitting: Family Medicine

## 2016-09-07 ENCOUNTER — Ambulatory Visit (INDEPENDENT_AMBULATORY_CARE_PROVIDER_SITE_OTHER): Payer: BLUE CROSS/BLUE SHIELD | Admitting: Family Medicine

## 2016-09-07 VITALS — BP 135/76 | HR 107 | Temp 98.4°F | Resp 16 | Ht 66.0 in | Wt 248.5 lb

## 2016-09-07 DIAGNOSIS — Z794 Long term (current) use of insulin: Secondary | ICD-10-CM

## 2016-09-07 DIAGNOSIS — E119 Type 2 diabetes mellitus without complications: Secondary | ICD-10-CM | POA: Diagnosis not present

## 2016-09-07 DIAGNOSIS — G4733 Obstructive sleep apnea (adult) (pediatric): Secondary | ICD-10-CM | POA: Diagnosis not present

## 2016-09-07 LAB — GLUCOSE, POCT (MANUAL RESULT ENTRY): POC GLUCOSE: 81 mg/dL (ref 70–99)

## 2016-09-07 LAB — POCT GLYCOSYLATED HEMOGLOBIN (HGB A1C): HEMOGLOBIN A1C: 5.2

## 2016-09-07 MED ORDER — METFORMIN HCL 1000 MG PO TABS: 1000.0000 mg | ORAL_TABLET | Freq: Two times a day (BID) | ORAL | 0 refills | Status: DC

## 2016-09-07 NOTE — Progress Notes (Signed)
Name: Alvin GrahamCharles Decarolos Green   MRN: [id number]    DOB: 07/20/1970   Date:09/07/2016       Progress Note  Subjective  Chief Complaint  Chief Complaint  Patient presents with  . Annual Exam    CPE    Diabetes  He presents for his follow-up diabetic visit. He has type 2 diabetes mellitus. His disease course has been stable. There are no hypoglycemic associated symptoms. Pertinent negatives for hypoglycemia include no dizziness, headaches or sweats. Pertinent negatives for diabetes include no chest pain, no fatigue, no foot paresthesias, no polydipsia and no polyuria. There are no diabetic complications. Pertinent negatives for diabetic complications include no CVA, heart disease or peripheral neuropathy. Current diabetic treatment includes oral agent (monotherapy) and intensive insulin program. His weight is stable. He is following a diabetic and generally healthy diet. His breakfast blood glucose range is generally 70-90 mg/dl. An ACE inhibitor/angiotensin II receptor blocker is not being taken.   Patient has sleep apnea and has been using CPAP for at least 10 years, his last sleep study was around that time. He would like to get a new CPAP machine as his current machine has gotten too old. He uses the machine every night, but his wife believes the sleep apnea may be getting worse, she reports that he snores more than before, wakes up tired, he believes he may need a new sleep study.    Past Medical History:  Diagnosis Date  . Decreased libido   . Heart murmur   . Obesity   . Sleep apnea in adult     Past Surgical History:  Procedure Laterality Date  . CARPAL TUNNEL RELEASE    . CARPAL TUNNEL RELEASE Right    approximately 10 years ago    Family History  Problem Relation Age of Onset  . Hypertension Mother   . Sleep disorder Father   . Hypertension Brother     Social History   Social History  . Marital status: Married    Spouse name: N/A  . Number of children: N/A  . Years  of education: N/A   Occupational History  . Not on file.   Social History Main Topics  . Smoking status: Current Every Day Smoker    Packs/day: 0.30    Years: 9.00    Types: Cigarettes  . Smokeless tobacco: Never Used  . Alcohol use 0.0 oz/week     Comment: occasionally  . Drug use: No  . Sexual activity: Yes    Partners: Female    Birth control/ protection: None   Other Topics Concern  . Not on file   Social History Narrative  . No narrative on file     Current Outpatient Prescriptions:  .  aspirin EC 81 MG tablet, Take 1 tablet (81 mg total) by mouth daily., Disp: 90 tablet, Rfl: 0 .  Insulin Glargine (LANTUS) 100 UNIT/ML Solostar Pen, Inject 10 Units into the skin daily at 10 pm., Disp: 15 mL, Rfl: 2 .  Insulin Pen Needle 32G X 6 MM MISC, 1 each by Does not apply route 3 (three) times daily., Disp: 100 each, Rfl: 0 .  metFORMIN (GLUCOPHAGE) 1000 MG tablet, Take 1 tablet (1,000 mg total) by mouth 2 (two) times daily with a meal., Disp: 180 tablet, Rfl: 0  No Known Allergies   Review of Systems  Constitutional: Negative for fatigue.  Cardiovascular: Negative for chest pain.  Neurological: Negative for dizziness and headaches.  Endo/Heme/Allergies: Negative for polydipsia.  Objective  Vitals:   09/07/16 1403  BP: 135/76  Pulse: (!) 107  Resp: 16  Temp: 98.4 F (36.9 C)  TempSrc: Oral  SpO2: 97%  Weight: 248 lb 8 oz (112.7 kg)  Height:  (1.676 m)    Physical Exam  Constitutional: He is oriented to person, place, and time and well-developed, well-nourished, and in no distress.  HENT:  Head: Normocephalic and atraumatic.  Cardiovascular: Normal rate, regular rhythm and normal heart sounds.   No murmur heard. Pulmonary/Chest: Effort normal and breath sounds normal. He has no wheezes.  Abdominal: Soft. Bowel sounds are normal.  Neurological: He is alert and oriented to person, place, and time.  Psychiatric: Mood, memory, affect and judgment  normal.  Nursing note and vitals reviewed.    Recent Results (from the past 2160 hour(s))  POCT Glucose (CBG)     Status: Normal   Collection Time: 09/07/16  2:32 PM  Result Value Ref Range   POC Glucose 81 70 - 99 mg/dl  POCT HgB W0J     Status: Normal   Collection Time: 09/07/16  2:40 PM  Result Value Ref Range   Hemoglobin A1C 5.2      Assessment & Plan  1. Controlled type 2 diabetes mellitus without complication, with [place]-term current use of insulin (HCC)   A1c is well controlled at 5.2%, he has completely changed his diet and physical lifestyle. We will DC the basal insulin at this stage, advised to check blood glucose regularly, follow up in 3 months - POCT HgB A1C - POCT Glucose (CBG) - metFORMIN (GLUCOPHAGE) 1000 MG tablet; Take 1 tablet (1,000 mg total) by mouth 2 (two) times daily with a meal.  Dispense: 180 tablet; Refill: 0 - Lipid panel - COMPLETE METABOLIC PANEL WITH GFR  2. Sleep apnea, obstructive Referral to obtain a new sleep study and CPAP if indicated - Ambulatory referral to Sleep Studies  Liandro Thelin [name] 09/07/2016 2:43 PM

## 2016-09-26 ENCOUNTER — Encounter: Payer: BLUE CROSS/BLUE SHIELD | Admitting: Family Medicine

## 2016-10-02 ENCOUNTER — Ambulatory Visit (INDEPENDENT_AMBULATORY_CARE_PROVIDER_SITE_OTHER): Payer: BLUE CROSS/BLUE SHIELD | Admitting: Family Medicine

## 2016-10-02 ENCOUNTER — Encounter: Payer: Self-pay | Admitting: Family Medicine

## 2016-10-02 VITALS — BP 132/78 | HR 89 | Ht 66.0 in | Wt 250.2 lb

## 2016-10-02 DIAGNOSIS — Z0000 Encounter for general adult medical examination without abnormal findings: Secondary | ICD-10-CM | POA: Diagnosis not present

## 2016-10-02 DIAGNOSIS — Z125 Encounter for screening for malignant neoplasm of prostate: Secondary | ICD-10-CM

## 2016-10-02 NOTE — Progress Notes (Signed)
Name: Alvin Green   MRN: [id number]    DOB: 03/13/1970   Date:10/02/2016       Progress Note  Subjective  Chief Complaint  Chief Complaint  Patient presents with  . Annual Exam  . Immunizations    declines flu vaccine     HPI  Pt. Presents for Complete Physical Exam.  His father was diagnosed with prostate cancer last year, had surgery and is now reportedly recovered. His grandfather also had prostate cancer as do some of his uncles.  He has never had colonoscopy.  He is due for prostate cancer screening.     Past Medical History:  Diagnosis Date  . Decreased libido   . Heart murmur   . Obesity   . Sleep apnea in adult     Past Surgical History:  Procedure Laterality Date  . CARPAL TUNNEL RELEASE    . CARPAL TUNNEL RELEASE Right    approximately 10 years ago    Family History  Problem Relation Age of Onset  . Hypertension Mother   . Sleep disorder Father   . Hypertension Brother     Social History   Social History  . Marital status: Married    Spouse name: N/A  . Number of children: N/A  . Years of education: N/A   Occupational History  . Not on file.   Social History Main Topics  . Smoking status: Current Every Day Smoker    Packs/day: 0.25    Years: 9.00    Types: Cigarettes  . Smokeless tobacco: Never Used  . Alcohol use 0.0 oz/week     Comment: occasionally  . Drug use: No  . Sexual activity: Yes    Partners: Female    Birth control/ protection: None   Other Topics Concern  . Not on file   Social History Narrative  . No narrative on file     Current Outpatient Prescriptions:  .  aspirin EC 81 MG tablet, Take 1 tablet (81 mg total) by mouth daily., Disp: 90 tablet, Rfl: 0 .  metFORMIN (GLUCOPHAGE) 1000 MG tablet, Take 1 tablet (1,000 mg total) by mouth 2 (two) times daily with a meal., Disp: 180 tablet, Rfl: 0  No Known Allergies   Review of Systems  Constitutional: Negative for chills, fever, malaise/fatigue and weight  loss.  HENT: Negative for congestion, ear pain and sore throat.   Eyes: Negative for blurred vision and double vision.  Respiratory: Negative for cough, sputum production and shortness of breath.   Cardiovascular: Negative for chest pain and leg swelling.  Gastrointestinal: Negative for abdominal pain, constipation, diarrhea, nausea and vomiting.  Genitourinary: Negative for dysuria and hematuria.  Musculoskeletal: Negative for back pain and neck pain.  Neurological: Negative for dizziness and headaches.  Psychiatric/Behavioral: Negative for depression. The patient is not nervous/anxious and does not have insomnia.      Objective  Vitals:   10/02/16 1510  BP: 132/78  Pulse: 89  SpO2: 98%  Weight: 250 lb 3.2 oz (113.5 kg)  Height:  (1.676 m)    Physical Exam  Constitutional: He is oriented to person, place, and time and well-developed, well-nourished, and in no distress.  HENT:  Head: Normocephalic and atraumatic.  Right Ear: External ear normal.  Left Ear: External ear normal.  Mouth/Throat: Oropharynx is clear and moist.  Eyes: Pupils are equal, round, and reactive to light. Conjunctivae are normal.  Neck: Neck supple.  Cardiovascular: Normal rate, regular rhythm and normal heart sounds.  No murmur heard. Pulmonary/Chest: Effort normal and breath sounds normal. He has no wheezes.  Abdominal: Soft. Bowel sounds are normal. There is no tenderness.  Genitourinary: Rectum normal and prostate normal.  Musculoskeletal: He exhibits no edema.  Neurological: He is alert and oriented to person, place, and time.  Psychiatric: Mood, memory, affect and judgment normal.  Nursing note and vitals reviewed.     Recent Results (from the past 2160 hour(s))  POCT Glucose (CBG)     Status: Normal   Collection Time: 09/07/16  2:32 PM  Result Value Ref Range   POC Glucose 81 70 - 99 mg/dl  POCT HgB Z6X     Status: Normal   Collection Time: 09/07/16  2:40 PM  Result Value Ref  Range   Hemoglobin A1C 5.2      Assessment & Plan  1. Annual physical exam Obtain age-appropriate laboratory screening - VITAMIN D 25 Hydroxy (Vit-D Deficiency, Fractures)  2. Screening for prostate cancer  - PSA   Daishawn Lauf [name] 10/02/2016 3:46 PM

## 2016-10-03 LAB — VITAMIN D 25 HYDROXY (VIT D DEFICIENCY, FRACTURES): VIT D 25 HYDROXY: 14 ng/mL — AB (ref 30–100)

## 2016-10-03 LAB — PSA: PSA: 0.5 ng/mL (ref <=4.0)

## 2016-10-04 ENCOUNTER — Telehealth: Payer: Self-pay

## 2016-10-04 DIAGNOSIS — E559 Vitamin D deficiency, unspecified: Secondary | ICD-10-CM

## 2016-10-04 MED ORDER — VITAMIN D (ERGOCALCIFEROL) 1.25 MG (50000 UNIT) PO CAPS: 50000.0000 [IU] | ORAL_CAPSULE | ORAL | 0 refills | Status: DC

## 2016-10-04 NOTE — Telephone Encounter (Signed)
-----   Message from Ellyn Hack, MD sent at 10/03/2016  9:00 AM EDT ----- PSA is within normal range Vitamin D is below normal at 14 ng per mL, consistent with vitamin D deficiency. He should be started on vitamin D3 50,000 units 1 capsule weekly 12 weeks, recheck levels after completion of treatment

## 2016-10-04 NOTE — Telephone Encounter (Signed)
Called pt no answer. No voicemail available. RX sent in. Will send mychart message.

## 2016-12-12 ENCOUNTER — Ambulatory Visit: Payer: BLUE CROSS/BLUE SHIELD | Admitting: Family Medicine

## 2016-12-14 ENCOUNTER — Ambulatory Visit: Payer: BLUE CROSS/BLUE SHIELD | Admitting: Family Medicine

## 2016-12-14 ENCOUNTER — Encounter: Payer: Self-pay | Admitting: Family Medicine

## 2016-12-14 VITALS — BP 124/76 | HR 70 | Temp 98.6°F | Resp 16 | Wt 249.9 lb

## 2016-12-14 DIAGNOSIS — E1121 Type 2 diabetes mellitus with diabetic nephropathy: Secondary | ICD-10-CM | POA: Diagnosis not present

## 2016-12-14 LAB — GLUCOSE, POCT (MANUAL RESULT ENTRY): POC Glucose: 86 mg/dl (ref 70–99)

## 2016-12-14 LAB — POCT GLYCOSYLATED HEMOGLOBIN (HGB A1C): Hemoglobin A1C: 5.3

## 2016-12-14 MED ORDER — METFORMIN HCL 500 MG PO TABS: 500.0000 mg | ORAL_TABLET | Freq: Two times a day (BID) | ORAL | 0 refills | Status: DC

## 2016-12-14 NOTE — Progress Notes (Signed)
Name: Alvin GrahamCharles Decarolos Green   MRN: [id number]    DOB: 10/16/1970   Date:12/14/2016       Progress Note  Subjective  Chief Complaint  Chief Complaint  Patient presents with  . Diabetes    Diabetes  He presents for his follow-up diabetic visit. He has type 2 diabetes mellitus. His disease course has been improving. Pertinent negatives for diabetes include no chest pain, no fatigue, no foot paresthesias, no polydipsia and no polyuria. Pertinent negatives for diabetic complications include no CVA, heart disease or peripheral neuropathy. Current diabetic treatment includes oral agent (monotherapy). He is following a diabetic and generally healthy (he stopped drinking Pepsi, decreased coffee consumption. ) diet. He participates in exercise daily (walk every day). He monitors blood glucose at home 1-2 x per day. His breakfast blood glucose range is generally 90-110 mg/dl. An ACE inhibitor/angiotensin II receptor blocker is not being taken.      Past Medical History:  Diagnosis Date  . Decreased libido   . Heart murmur   . Obesity   . Sleep apnea in adult     Past Surgical History:  Procedure Laterality Date  . CARPAL TUNNEL RELEASE    . CARPAL TUNNEL RELEASE Right    approximately 10 years ago    Family History  Problem Relation Age of Onset  . Hypertension Mother   . Sleep disorder Father   . Hypertension Brother     Social History   Socioeconomic History  . Marital status: Married    Spouse name: Not on file  . Number of children: Not on file  . Years of education: Not on file  . Highest education level: Not on file  Social Needs  . Financial resource strain: Not on file  . Food insecurity - worry: Not on file  . Food insecurity - inability: Not on file  . Transportation needs - medical: Not on file  . Transportation needs - non-medical: Not on file  Occupational History  . Not on file  Tobacco Use  . Smoking status: Current Every Day Smoker    Packs/day: 0.25   Years: 9.00    Pack years: 2.25    Types: Cigarettes  . Smokeless tobacco: Never Used  Substance and Sexual Activity  . Alcohol use: Yes    Alcohol/week: 0.0 oz    Comment: occasionally  . Drug use: No  . Sexual activity: Yes    Partners: Female    Birth control/protection: None  Other Topics Concern  . Not on file  Social History Narrative  . Not on file     Current Outpatient Medications:  .  aspirin EC 81 MG tablet, Take 1 tablet (81 mg total) by mouth daily., Disp: 90 tablet, Rfl: 0 .  metFORMIN (GLUCOPHAGE) 1000 MG tablet, Take 1 tablet (1,000 mg total) by mouth 2 (two) times daily with a meal., Disp: 180 tablet, Rfl: 0 .  Vitamin D, Ergocalciferol, (DRISDOL) 50000 units CAPS capsule, Take 1 capsule (50,000 Units total) by mouth every 7 (seven) days. (Patient not taking: Reported on 12/14/2016), Disp: 12 capsule, Rfl: 0  No Known Allergies   Review of Systems  Constitutional: Negative for fatigue.  Cardiovascular: Negative for chest pain.  Endo/Heme/Allergies: Negative for polydipsia.     Objective  Vitals:   12/14/16 1020  BP: 124/76  Pulse: 70  Resp: 16  Temp: 98.6 F (37 C)  TempSrc: Oral  SpO2: 99%  Weight: 249 lb 14.4 oz (113.4 kg)    Physical  Exam  Constitutional: He is oriented to person, place, and time and well-developed, well-nourished, and in no distress.  HENT:  Head: Normocephalic and atraumatic.  Cardiovascular: Normal rate, regular rhythm, S1 normal and S2 normal.  Murmur heard.  Systolic murmur is present with a grade of 1/6. Neurological: He is alert and oriented to person, place, and time.  Psychiatric: Affect normal.  Nursing note and vitals reviewed.       Assessment & Plan  1. Controlled type 2 diabetes mellitus with diabetic nephropathy, without [place]-term current use of insulin (HCC) A1c is 5.3%, well-controlled diabetes, patient has changed his diet and lifestyle, advised to decrease metformin to 500 mg twice a day,  reassess in 3 months - POCT HgB A1C - POCT Glucose (CBG) - metFORMIN (GLUCOPHAGE) 500 MG tablet; Take 1 tablet (500 mg total) by mouth 2 (two) times daily with a meal.  Dispense: 180 tablet; Refill: 0   Tiesha Marich Asad A[facility] 12/14/2016 10:26 AM

## 2017-01-09 ENCOUNTER — Ambulatory Visit: Payer: BLUE CROSS/BLUE SHIELD | Admitting: Family Medicine

## 2017-01-09 ENCOUNTER — Encounter: Payer: Self-pay | Admitting: Family Medicine

## 2017-01-09 VITALS — BP 134/78 | HR 87 | Temp 98.7°F | Resp 16 | Wt 253.9 lb

## 2017-01-09 DIAGNOSIS — R0789 Other chest pain: Secondary | ICD-10-CM | POA: Diagnosis not present

## 2017-01-09 NOTE — Progress Notes (Signed)
Name: Alvin Green   MRN: [id number]    DOB: 1970/12/13   Date:01/09/2017       Progress Note  Subjective  Chief Complaint  Chief Complaint  Patient presents with  . Chest Pain    Pt states it started two weeks ago alone with the cough. Pt states its also happens when he reach; sharp pain. Pt have a  history of chest pain in 2016. Pt states he has had headaches but denies blurred vision, lighhead and pain any where eles.   . Bodyaches and chills    Last to weeks. Pt states he gets hot and then cold.  . Nasal Congestion    chest and nasal last two weeks   . Cough    two weeks pt denies sore throat and fever, Pt states ocne he cough his chect becomes sore    Chest Pain   This is a new problem. The current episode started in the past 7 days (2 days ago). The pain is present in the lateral region. The pain is at a severity of 5/10. The pain is moderate. The pain does not radiate. Associated symptoms include a cough (had cough for 2 weeks, now resolving, still have nasal and sinus congestion, ) and sputum production. Pertinent negatives include no dizziness, leg pain, lower extremity edema, nausea, palpitations, shortness of breath or vomiting. The cough is productive. Treatments tried: has taken Nyquil, Theraflu and Mucinex.  Cough  Associated symptoms include chest pain. Pertinent negatives include no shortness of breath.     Past Medical History:  Diagnosis Date  . Decreased libido   . Heart murmur   . Obesity   . Sleep apnea in adult     Past Surgical History:  Procedure Laterality Date  . CARPAL TUNNEL RELEASE    . CARPAL TUNNEL RELEASE Right    approximately 10 years ago    Family History  Problem Relation Age of Onset  . Hypertension Mother   . Sleep disorder Father   . Hypertension Brother     Social History   Socioeconomic History  . Marital status: Married    Spouse name: Not on file  . Number of children: Not on file  . Years of education: Not on file   . Highest education level: Not on file  Social Needs  . Financial resource strain: Not on file  . Food insecurity - worry: Not on file  . Food insecurity - inability: Not on file  . Transportation needs - medical: Not on file  . Transportation needs - non-medical: Not on file  Occupational History  . Not on file  Tobacco Use  . Smoking status: Current Every Day Smoker    Packs/day: 0.25    Years: 9.00    Pack years: 2.25    Types: Cigarettes  . Smokeless tobacco: Never Used  Substance and Sexual Activity  . Alcohol use: Yes    Alcohol/week: 0.0 oz    Comment: occasionally  . Drug use: No  . Sexual activity: Yes    Partners: Female    Birth control/protection: None  Other Topics Concern  . Not on file  Social History Narrative  . Not on file     Current Outpatient Medications:  .  aspirin EC 81 MG tablet, Take 1 tablet (81 mg total) by mouth daily., Disp: 90 tablet, Rfl: 0 .  metFORMIN (GLUCOPHAGE) 500 MG tablet, Take 1 tablet (500 mg total) by mouth 2 (two) times daily with a  meal., Disp: 180 tablet, Rfl: 0 .  Vitamin D, Ergocalciferol, (DRISDOL) 50000 units CAPS capsule, Take 1 capsule (50,000 Units total) by mouth every 7 (seven) days. (Patient not taking: Reported on 12/14/2016), Disp: 12 capsule, Rfl: 0  No Known Allergies   Review of Systems  Respiratory: Positive for cough (had cough for 2 weeks, now resolving, still have nasal and sinus congestion, ) and sputum production. Negative for shortness of breath.   Cardiovascular: Positive for chest pain. Negative for palpitations.  Gastrointestinal: Negative for nausea and vomiting.  Neurological: Negative for dizziness.      Objective  Vitals:   01/09/17 1330  BP: 134/78  Pulse: 87  Resp: 16  Temp: 98.7 F (37.1 C)  TempSrc: Oral  SpO2: 97%  Weight: 253 lb 14.4 oz (115.2 kg)    Physical Exam  Constitutional: He is oriented to person, place, and time and well-developed, well-nourished, and in no  distress.  HENT:  Head: Normocephalic and atraumatic.  Cardiovascular: Normal rate, regular rhythm and normal heart sounds.  No murmur heard. Pulmonary/Chest: Effort normal and breath sounds normal. He has no wheezes. He exhibits tenderness. He exhibits no edema and no deformity.    Neurological: He is alert and oriented to person, place, and time. GCS score is 15.  Psychiatric: Mood, memory, affect and judgment normal.  Nursing note and vitals reviewed.    Recent Results (from the past 2160 hour(s))  POCT Glucose (CBG)     Status: Normal   Collection Time: 12/14/16 10:20 AM  Result Value Ref Range   POC Glucose 86 70 - 99 mg/dl  POCT HgB [id number]     Status: Normal   Collection Time: 12/14/16 10:25 AM  Result Value Ref Range   Hemoglobin A1C 5.3      Assessment & Plan  1. Acute chest wall pain EKG is reassuring, chest pain is reproducible with palpation suggesting costochondritis, substantiated by the presence of cough, we'll obtain chest x-ray and follow-up. Advised patient to use OTC NSAIDs for relief. - DG Chest 2 View; Future - EKG 12-Lead   Tacey Dimaggio Asad A. [facility] 01/09/2017 1:49 PM

## 2017-01-10 ENCOUNTER — Ambulatory Visit
Admission: RE | Admit: 2017-01-10 | Discharge: 2017-01-10 | Disposition: A | Discharge status: Home / Self Care | Payer: BLUE CROSS/BLUE SHIELD | Source: Ambulatory Visit | Attending: Family Medicine | Admitting: Family Medicine

## 2017-01-10 DIAGNOSIS — R918 Other nonspecific abnormal finding of lung field: Secondary | ICD-10-CM | POA: Insufficient documentation

## 2017-01-10 DIAGNOSIS — R0789 Other chest pain: Secondary | ICD-10-CM

## 2017-01-10 DIAGNOSIS — R05 Cough: Secondary | ICD-10-CM | POA: Diagnosis not present

## 2017-03-15 ENCOUNTER — Ambulatory Visit: Payer: BLUE CROSS/BLUE SHIELD | Admitting: Family Medicine

## 2017-03-15 ENCOUNTER — Encounter: Payer: Self-pay | Admitting: Family Medicine

## 2017-03-15 VITALS — BP 126/76 | HR 81 | Temp 98.2°F | Wt 256.0 lb

## 2017-03-15 DIAGNOSIS — E119 Type 2 diabetes mellitus without complications: Secondary | ICD-10-CM

## 2017-03-15 DIAGNOSIS — B353 Tinea pedis: Secondary | ICD-10-CM | POA: Diagnosis not present

## 2017-03-15 DIAGNOSIS — E559 Vitamin D deficiency, unspecified: Secondary | ICD-10-CM | POA: Diagnosis not present

## 2017-03-15 DIAGNOSIS — B070 Plantar wart: Secondary | ICD-10-CM

## 2017-03-15 NOTE — Patient Instructions (Addendum)
  Please do see your eye doctor regularly, and have your eyes examined every year (or more often per his or her recommendation) Check your feet every night and let me know right away of any sores, infections, numbness, etc. Try to limit sweets, white bread, white rice, white potatoes It is okay with me for you to not check your fingerstick blood sugars (per [employer] Best Practices), unless you are interested and feel it would be helpful for you Check out the information at familydoctor.org entitled "Nutrition for Weight Loss: What You Need to Know about Fad Diets" Try to lose between 1-2 pounds per week by taking in fewer calories and burning off more calories You can succeed by limiting portions, limiting foods dense in calories and fat, becoming more active, and drinking 8 glasses of water a day (64 ounces) Don't skip meals, especially breakfast, as skipping meals may alter your metabolism Do not use over-the-counter weight loss pills or gimmicks that claim rapid weight loss A healthy BMI (or body mass index) is between 18.5 and 24.9 You can calculate your ideal BMI at the NIH website [employer]huhttp://www.nhlbi.nih.gov/health/educational/lose_wt/BMI/bmicalc.htm You can try tolnaftate for the scale, rash You can try Duofilm for the rough, warty spot, daily for about a month

## 2017-03-15 NOTE — Assessment & Plan Note (Signed)
Patient completed Rx vit D; he may continue OTC; we discussed labs today, but we opted to recheck at next visit

## 2017-03-15 NOTE — Progress Notes (Signed)
BP 126/76 (BP Location: Left Arm, Patient Position: Sitting, Cuff Size: Large)   Pulse 81   Temp 98.2 F (36.8 C) (Oral)   Wt 256 lb (116.1 kg)   SpO2 98%   BMI 41.32 kg/m    Subjective:    Patient ID: Alvin Green, male    DOB: 08/21/1970, 47 y.o.   MRN: [id number]  HPI: Alvin Green is a 47 y.o. male  Chief Complaint  Patient presents with  . 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 Decreased libido   . Heart murmur   . Obesity   . Sleep apnea in adult    Past Surgical History:  Procedure Laterality Date  . CARPAL TUNNEL RELEASE    . CARPAL TUNNEL RELEASE Right    approximately 10 years ago   Family History  Problem Relation Age of Onset  . Hypertension Mother   . Sleep disorder Father   . Hypertension Brother    Social History   Tobacco Use  . Smoking status: Current Every Day Smoker    Packs/day:  0.25    Years: 9.00    Pack years: 2.25    Types: Cigarettes  . Smokeless tobacco: Never Used  Substance Use Topics  . Alcohol use: Yes    Alcohol/week: 0.0 oz    Comment: occasionally  . Drug use: No    Interim medical history since last visit reviewed. Allergies and medications reviewed  Review of Systems Per HPI unless specifically indicated above     Objective:    BP 126/76 (BP Location: Left Arm, Patient Position: Sitting, Cuff Size: Large)   Pulse 81   Temp 98.2 F (36.8 C) (Oral)   Wt 256 lb (116.1 kg)   SpO2 98%   BMI 41.32 kg/m   Wt Readings from Last 3 Encounters:  03/15/17 256 lb (116.1 kg)  01/09/17 253 lb 14.4 oz (115.2 kg)  12/14/16 249 lb 14.4 oz (113.4 kg)    Physical Exam  Constitutional: He appears well-developed and well-nourished. No distress.  Morbidly obese  Eyes: No scleral icterus.  Cardiovascular: Normal rate and regular rhythm.  Pulmonary/Chest: Effort normal and breath sounds normal.  Abdominal: He exhibits no distension.  Neurological: He is alert.  Skin: No pallor.  Psychiatric: He has a normal mood and affect.   Diabetic Foot  Form - Detailed   Diabetic Foot Exam - detailed Diabetic Foot exam was performed with the following findings:  Yes 03/15/2017 12:25 PM  Visual Foot Exam completed.:  Yes  Pulse Foot Exam completed.:  Yes  Right Dorsalis Pedis:  Present Left Dorsalis Pedis:  Present  Sensory Foot Exam Completed.:  Yes Semmes-Weinstein Monofilament Test R Site 1-Great Toe:  Pos L Site 1-Great Toe:  Pos    Comments:  Scale on right foot; small corn or verrucous papule lateral left foot      Results for orders placed or performed in visit on 12/14/16  POCT HgB A1C  Result Value Ref Range   Hemoglobin A1C 5.3   POCT Glucose (CBG)  Result Value Ref Range   POC Glucose 86 70 - 99 mg/dl      Assessment & Plan:   Problem List Items Addressed This Visit      Endocrine   Type 2 diabetes mellitus (HCC) - Primary (Chronic)     Check urine microalbumin; last A1c UTD, no need to repeat; weight loss and activity and healthy eating; foot exam UTD; eye exam due soon, eye doctor sends him info; we mutually agreed on no blood work today and plan to check at next visit      Relevant Orders   Microalbumin / creatinine urine ratio     Other   Vitamin D deficiency    Patient completed Rx vit D; he may continue OTC; we discussed labs today, but we opted to recheck at next visit      Morbid obesity (HCC) (Chronic)    Encouraged weight loss; he'll try to pick up the water intake, stay active at work; offered nutrition referral, but patient has a good handle on what to eat       Other Visit Diagnoses    Tinea pedis of right foot       recommended patient try tolnaftate   Verruca plantaris       try duofilm, available OTC       Follow up plan: Return in about 4 months (around 07/15/2017).  An after-visit summary was printed and given to the patient at check-out.  Please see the patient instructions which may contain other information and recommendations beyond what is mentioned above in the assessment and plan.  No orders of the defined types were placed in this encounter.   Orders Placed This Encounter  Procedures  . Microalbumin / creatinine urine ratio

## 2017-03-15 NOTE — Assessment & Plan Note (Addendum)
Encouraged weight loss; he'll try to pick up the water intake, stay active at work; offered nutrition referral, but patient has a good handle on what to eat

## 2017-03-15 NOTE — Assessment & Plan Note (Addendum)
Check urine microalbumin; last A1c UTD, no need to repeat; weight loss and activity and healthy eating; foot exam UTD; eye exam due soon, eye doctor sends him info; we mutually agreed on no blood work today and plan to check at next visit

## 2017-03-16 LAB — MICROALBUMIN / CREATININE URINE RATIO
CREATININE, URINE: 83 mg/dL (ref 20–320)
Microalb Creat Ratio: 16 mcg/mg creat (ref <30)
Microalb, Ur: 1.3 mg/dL

## 2017-07-17 ENCOUNTER — Ambulatory Visit: Payer: BLUE CROSS/BLUE SHIELD | Admitting: Family Medicine

## 2017-07-20 ENCOUNTER — Ambulatory Visit: Payer: BLUE CROSS/BLUE SHIELD | Admitting: Family Medicine

## 2017-07-20 ENCOUNTER — Encounter: Payer: Self-pay | Admitting: Family Medicine

## 2017-07-20 VITALS — BP 150/78 | HR 83 | Temp 98.4°F | Resp 14 | Ht 66.0 in | Wt 261.3 lb

## 2017-07-20 DIAGNOSIS — R030 Elevated blood-pressure reading, without diagnosis of hypertension: Secondary | ICD-10-CM

## 2017-07-20 DIAGNOSIS — G4733 Obstructive sleep apnea (adult) (pediatric): Secondary | ICD-10-CM

## 2017-07-20 DIAGNOSIS — Z72820 Sleep deprivation: Secondary | ICD-10-CM

## 2017-07-20 DIAGNOSIS — E119 Type 2 diabetes mellitus without complications: Secondary | ICD-10-CM | POA: Diagnosis not present

## 2017-07-20 DIAGNOSIS — E559 Vitamin D deficiency, unspecified: Secondary | ICD-10-CM

## 2017-07-20 NOTE — Assessment & Plan Note (Signed)
No charge CMA visit for recheck blood pressure; patient politely declined BP medicine and will try DASH guidelines instead; referring to get OSA checked out, adequate treatment should resolve the BP issue

## 2017-07-20 NOTE — Progress Notes (Signed)
BP (!) 150/78   Pulse 83   Temp 98.4 F (36.9 C) (Oral)   Resp 14   Ht 5\' 6"  (1.676 m)   Wt 261 lb 4.8 oz (118.5 kg)   SpO2 94%   BMI 42.17 kg/m    Subjective:    Patient ID: Alvin Green, male    DOB: 03/19/1970, 47 y.o.   MRN: [id number]  HPI: Alvin GrahamCharles Decarolos Schroth is a 47 y.o. male  Chief Complaint  Patient presents with  . Follow-up    HPI Patient is here for f/u  Type 2 diabetes in the past Lab Results  Component Value Date   HGBA1C 5.3 12/14/2016    His blood pressure is high today; more stress, job related; sleeping okay, normally gets 4-5 hours of sleep; just used to it; good restful sleep; had some chili with salt BP Readings from Last 3 Encounters:  07/20/17 (!) 150/78  03/15/17 126/76  01/09/17 134/78   He is morbidly obese; stress related perhaps; drinks water and trying to keep urine pale yellow; not drinking many sodas; he knows what he needs to do and has done it before  He smokes, probably more than before; first cig about an hour; last cig of the day is about an hour before bed; more of a habit, not addictive, not like he has to; not sure about being ready to break the habit; more as a crutch to deal with stress; thinking about exercise room; he has quit cold [place] several times already; siblings smoking way before he was; not until 47 years old  Low vitamin D; really low at 1414 in October; energy level is meh; drinks coffee for the caffeine  Depression screen [facility] 2/9 07/20/2017 03/15/2017 01/09/2017 12/14/2016 10/02/2016  Decreased Interest 0 0 0 0 0  Down, Depressed, Hopeless 0 0 0 0 0  PHQ - 2 Score 0 0 0 0 0    Relevant past medical, surgical, family and social history reviewed Past Medical History:  Diagnosis Date  . Decreased libido   . Heart murmur   . Obesity   . Sleep apnea in adult    Past Surgical History:  Procedure Laterality Date  . CARPAL TUNNEL RELEASE    . CARPAL TUNNEL RELEASE Right    approximately 10 years ago     Family History  Problem Relation Age of Onset  . Hypertension Mother   . Sleep disorder Father   . Hypertension Brother    Social History   Tobacco Use  . Smoking status: Current Every Day Smoker    Packs/day: 0.25    Years: 9.00    Pack years: 2.25    Types: Cigarettes  . Smokeless tobacco: Never Used  Substance Use Topics  . Alcohol use: Yes    Alcohol/week: 0.0 oz    Comment: occasionally  . Drug use: No    Interim medical history since last visit reviewed. Allergies and medications reviewed  Review of Systems Per HPI unless specifically indicated above     Objective:    BP (!) 150/78   Pulse 83   Temp 98.4 F (36.9 C) (Oral)   Resp 14   Ht 5\' 6"  (1.676 m)   Wt 261 lb 4.8 oz (118.5 kg)   SpO2 94%   BMI 42.17 kg/m   Wt Readings from Last 3 Encounters:  07/20/17 261 lb 4.8 oz (118.5 kg)  03/15/17 256 lb (116.1 kg)  01/09/17 253 lb 14.4 oz (115.2 kg)  Physical Exam  Constitutional: He appears well-developed and well-nourished. No distress.  HENT:  Head: Normocephalic and atraumatic.  Eyes: EOM are normal. No scleral icterus.  Neck: No thyromegaly present.  Cardiovascular: Normal rate and regular rhythm.  Pulmonary/Chest: Effort normal and breath sounds normal.  Abdominal: Soft. Bowel sounds are normal. He exhibits no distension.  Musculoskeletal: He exhibits no edema.  Neurological: Coordination normal.  Skin: Skin is warm and dry. No pallor.  Psychiatric: He has a normal mood and affect. His behavior is normal. Judgment and thought content normal.   Results for orders placed or performed in visit on 03/15/17  Microalbumin / creatinine urine ratio  Result Value Ref Range   Creatinine, Urine 83 20 - 320 mg/dL   Microalb, Ur 1.3 mg/dL   Microalb Creat Ratio 16 <30 mcg/mg creat      Assessment & Plan:   Problem List Items Addressed This Visit      Respiratory   Obstructive sleep apnea    Runs in the family; refer to pulm for sleep study and  CPAP titration; weight loss would help      Relevant Orders   Ambulatory referral to Pulmonology     Endocrine   Type 2 diabetes mellitus (HCC) - Primary (Chronic)    Foot exam by MD; eye exams yearly; work on weight loss      Relevant Orders   Hemoglobin A1C   Ambulatory referral to Ophthalmology   COMPLETE METABOLIC PANEL WITH GFR     Other   Vitamin D deficiency    Check level and supplement if causing fatigue      Relevant Orders   VITAMIN D 25 Hydroxy (Vit-D Deficiency, Fractures)   Poor sleep    Refer to pulm for sleep study      Relevant Orders   Ambulatory referral to Pulmonology   Elevated blood pressure reading    No charge CMA visit for recheck blood pressure; patient politely declined BP medicine and will try DASH guidelines instead; referring to get OSA checked out, adequate treatment should resolve the BP issue          Follow up plan: Return in about 6 months (around 01/20/2018).  An after-visit summary was printed and given to the patient at check-out.  Please see the patient instructions which may contain other information and recommendations beyond what is mentioned above in the assessment and plan.  No orders of the defined types were placed in this encounter.   Orders Placed This Encounter  Procedures  . Hemoglobin A1C  . COMPLETE METABOLIC PANEL WITH GFR  . VITAMIN D 25 Hydroxy (Vit-D Deficiency, Fractures)  . Ambulatory referral to Ophthalmology  . Ambulatory referral to Pulmonology

## 2017-07-20 NOTE — Assessment & Plan Note (Signed)
Check level and supplement if causing fatigue

## 2017-07-20 NOTE — Assessment & Plan Note (Signed)
Runs in the family; refer to pulm for sleep study and CPAP titration; weight loss would help

## 2017-07-20 NOTE — Assessment & Plan Note (Signed)
Refer to pulm for sleep study 

## 2017-07-20 NOTE — Assessment & Plan Note (Signed)
Foot exam by MD; eye exams yearly; work on weight loss

## 2017-07-20 NOTE — Patient Instructions (Addendum)
Try tolnaftate for the feet, use per package directions We'll get labs today and refer you to the pulmonologist If you have not heard anything from my staff in a week about any orders/referrals/studies from today, please contact us here to follow-up [phone]  Check out the information at familydoctor.org entitled "Nutrition for Weight Loss: What You Need to Know about Fad Diets" Try to lose between 1-2 pounds per week by taking in fewer calories and burning off more calories You can succeed by limiting portions, limiting foods dense in calories and fat, becoming more active, and drinking 8 glasses of water a day (64 ounces) Don't skip meals, especially breakfast, as skipping meals may alter your metabolism Do not use over-the-counter weight loss pills or gimmicks that claim rapid weight loss A healthy BMI (or body mass index) is between 18.5 and 24.9 You can calculate your ideal BMI at the NIH website JobEconomics.hu  I do encourage you to quit smoking Call ([phone] to sign up for smoking cessation classes You can call 1-800-QUIT-NOW to talk with a smoking cessation coach   DASH Eating Plan DASH stands for "Dietary Approaches to Stop Hypertension." The DASH eating plan is a healthy eating plan that has been shown to reduce high blood pressure (hypertension). It may also reduce your risk for type 2 diabetes, heart disease, and stroke. The DASH eating plan may also help with weight loss. What are tips for following this plan? General guidelines  Avoid eating more than 2,300 mg (milligrams) of salt (sodium) a day. If you have hypertension, you may need to reduce your sodium intake to 1,500 mg a day.  Limit alcohol intake to no more than 1 drink a day for nonpregnant women and 2 drinks a day for men. One drink equals 12 oz of beer, 5 oz of wine, or 1 oz of hard liquor.  Work with your health care provider to maintain a healthy body  weight or to lose weight. Ask what an ideal weight is for you.  Get at least 30 minutes of exercise that causes your heart to beat faster (aerobic exercise) most days of the week. Activities may include walking, swimming, or biking.  Work with your health care provider or diet and nutrition specialist (dietitian) to adjust your eating plan to your individual calorie needs. Reading food labels  Check food labels for the amount of sodium per serving. Choose foods with less than 5 percent of the Daily Value of sodium. Generally, foods with less than 300 mg of sodium per serving fit into this eating plan.  To find whole grains, look for the word "whole" as the first word in the ingredient list. Shopping  Buy products labeled as "low-sodium" or "no salt added."  Buy fresh foods. Avoid canned foods and premade or frozen meals. Cooking  Avoid adding salt when cooking. Use salt-free seasonings or herbs instead of table salt or sea salt. Check with your health care provider or pharmacist before using salt substitutes.  Do not fry foods. Cook foods using healthy methods such as baking, boiling, grilling, and broiling instead.  Cook with heart-healthy oils, such as olive, canola, soybean, or sunflower oil. Meal planning   Eat a balanced diet that includes: ? 5 or more servings of fruits and vegetables each day. At each meal, try to fill half of your plate with fruits and vegetables. ? Up to 6-8 servings of whole grains each day. ? Less than 6 oz of lean meat, poultry, or fish each  day. A 3-oz serving of meat is about the same size as a deck of cards. One egg equals 1 oz. ? 2 servings of low-fat dairy each day. ? A serving of nuts, seeds, or beans 5 times each week. ? Heart-healthy fats. Healthy fats called Omega-3 fatty acids are found in foods such as flaxseeds and coldwater fish, like sardines, salmon, and mackerel.  Limit how much you eat of the following: ? Canned or prepackaged  foods. ? Food that is high in trans fat, such as fried foods. ? Food that is high in saturated fat, such as fatty meat. ? Sweets, desserts, sugary drinks, and other foods with added sugar. ? Full-fat dairy products.  Do not salt foods before eating.  Try to eat at least 2 vegetarian meals each week.  Eat more home-cooked food and less restaurant, buffet, and fast food.  When eating at a restaurant, ask that your food be prepared with less salt or no salt, if possible. What foods are recommended? The items listed may not be a complete list. Talk with your dietitian about what dietary choices are best for you. Grains Whole-grain or whole-wheat bread. Whole-grain or whole-wheat pasta. Brown rice. Orpah Cobb. Bulgur. Whole-grain and low-sodium cereals. Pita bread. Low-fat, low-sodium crackers. Whole-wheat flour tortillas. Vegetables Fresh or frozen vegetables (raw, steamed, roasted, or grilled). Low-sodium or reduced-sodium tomato and vegetable juice. Low-sodium or reduced-sodium tomato sauce and tomato paste. Low-sodium or reduced-sodium canned vegetables. Fruits All fresh, dried, or frozen fruit. Canned fruit in natural juice (without added sugar). Meat and other protein foods Skinless chicken or [place]. Ground chicken or [place]. Pork with fat trimmed off. Fish and seafood. Egg whites. Dried beans, peas, or lentils. Unsalted nuts, nut butters, and seeds. Unsalted canned beans. Lean cuts of beef with fat trimmed off. Low-sodium, lean deli meat. Dairy Low-fat (1%) or fat-free (skim) milk. Fat-free, low-fat, or reduced-fat cheeses. Nonfat, low-sodium ricotta or cottage cheese. Low-fat or nonfat yogurt. Low-fat, low-sodium cheese. Fats and oils Soft margarine without trans fats. Vegetable oil. Low-fat, reduced-fat, or light mayonnaise and salad dressings (reduced-sodium). Canola, safflower, olive, soybean, and sunflower oils. Avocado. Seasoning and other foods Herbs. Spices. Seasoning  mixes without salt. Unsalted popcorn and pretzels. Fat-free sweets. What foods are not recommended? The items listed may not be a complete list. Talk with your dietitian about what dietary choices are best for you. Grains Baked goods made with fat, such as croissants, muffins, or some breads. Dry pasta or rice meal packs. Vegetables Creamed or fried vegetables. Vegetables in a cheese sauce. Regular canned vegetables (not low-sodium or reduced-sodium). Regular canned tomato sauce and paste (not low-sodium or reduced-sodium). Regular tomato and vegetable juice (not low-sodium or reduced-sodium). Rosita Fire. Olives. Fruits Canned fruit in a light or heavy syrup. Fried fruit. Fruit in cream or butter sauce. Meat and other protein foods Fatty cuts of meat. Ribs. Fried meat. Tomasa Blase. Sausage. Bologna and other processed lunch meats. Salami. Fatback. Hotdogs. Bratwurst. Salted nuts and seeds. Canned beans with added salt. Canned or smoked fish. Whole eggs or egg yolks. Chicken or [place] with skin. Dairy Whole or 2% milk, cream, and half-and-half. Whole or full-fat cream cheese. Whole-fat or sweetened yogurt. Full-fat cheese. Nondairy creamers. Whipped toppings. Processed cheese and cheese spreads. Fats and oils Butter. Stick margarine. Lard. Shortening. Ghee. Bacon fat. Tropical oils, such as coconut, palm kernel, or palm oil. Seasoning and other foods Salted popcorn and pretzels. Onion salt, garlic salt, seasoned salt, table salt, and sea salt. Worcestershire sauce.  Tartar sauce. Barbecue sauce. Teriyaki sauce. Soy sauce, including reduced-sodium. Steak sauce. Canned and packaged gravies. Fish sauce. Oyster sauce. Cocktail sauce. Horseradish that you find on the shelf. Ketchup. Mustard. Meat flavorings and tenderizers. Bouillon cubes. Hot sauce and Tabasco sauce. Premade or packaged marinades. Premade or packaged taco seasonings. Relishes. Regular salad dressings. Where to find more information:  National  Heart, Lung, and Blood Institute: PopSteam.iswww.nhlbi.nih.gov  American Heart Association: www.heart.org Summary  The DASH eating plan is a healthy eating plan that has been shown to reduce high blood pressure (hypertension). It may also reduce your risk for type 2 diabetes, heart disease, and stroke.  With the DASH eating plan, you should limit salt (sodium) intake to 2,300 mg a day. If you have hypertension, you may need to reduce your sodium intake to 1,500 mg a day.  When on the DASH eating plan, aim to eat more fresh fruits and vegetables, whole grains, lean proteins, low-fat dairy, and heart-healthy fats.  Work with your health care provider or diet and nutrition specialist (dietitian) to adjust your eating plan to your individual calorie needs. This information is not intended to replace advice given to you by your health care provider. Make sure you discuss any questions you have with your health care provider. Document Released: 12/08/2010 Document Revised: 12/13/2015 Document Reviewed: 12/13/2015 Elsevier Interactive Patient Education  [employer]2018 Elsevier Inc.

## 2017-07-24 ENCOUNTER — Institutional Professional Consult (permissible substitution): Payer: BLUE CROSS/BLUE SHIELD | Admitting: Internal Medicine

## 2017-08-02 ENCOUNTER — Ambulatory Visit (INDEPENDENT_AMBULATORY_CARE_PROVIDER_SITE_OTHER): Payer: BLUE CROSS/BLUE SHIELD | Admitting: Internal Medicine

## 2017-08-02 ENCOUNTER — Encounter: Payer: Self-pay | Admitting: Internal Medicine

## 2017-08-02 VITALS — BP 118/64 | HR 74 | Resp 16 | Ht 66.0 in | Wt 259.0 lb

## 2017-08-02 DIAGNOSIS — S63659A Sprain of metacarpophalangeal joint of unspecified finger, initial encounter: Secondary | ICD-10-CM | POA: Insufficient documentation

## 2017-08-02 DIAGNOSIS — M6530 Trigger finger, unspecified finger: Secondary | ICD-10-CM

## 2017-08-02 DIAGNOSIS — F17210 Nicotine dependence, cigarettes, uncomplicated: Secondary | ICD-10-CM | POA: Diagnosis not present

## 2017-08-02 DIAGNOSIS — G4733 Obstructive sleep apnea (adult) (pediatric): Secondary | ICD-10-CM

## 2017-08-02 DIAGNOSIS — G4719 Other hypersomnia: Secondary | ICD-10-CM

## 2017-08-02 HISTORY — DX: Trigger finger, unspecified finger: M65.30

## 2017-08-02 HISTORY — DX: Sprain of metacarpophalangeal joint of unspecified finger, initial encounter: S63.659A

## 2017-08-02 NOTE — Progress Notes (Signed)
Name: Alvin Green MRN: [id number] DOB: 11/04/1970     CONSULTATION DATE: 08/02/2017 REFERRING MD : L ADA  CHIEF COMPLAINT: Excessive daytime sleepiness  STUDIES:     01/10/2017 CXR independently reviewed by Me No acute infiltrates no pneumonia no masses seen no effusions   HISTORY OF PRESENT ILLNESS: 47 year old pleasant African-American male seen today for excessive daytime sleepiness Epworth sleep score is 11  Patient has been diagnosed with sleep apnea approximately 10 years ago Patient uses full facemask CPAP but he does not know his pressures Overall in the last couple years it has not been helping him with his sleep apnea He wakes up tired with non-refreshed sleep Has increased fatigue throughout the day  At this time no signs and symptoms of shortness of breath chest pain or palpitations No signs of infection at this time No signs of heart failure  Patient smokes 1/2 pack/day for the last 20 years   Smoking Assessment and Cessation Counseling   Upon further questioning, Patient smokes 1/2  I have advised patient to quit/stop smoking as soon as possible due to high risk for multiple medical problems  Patient is NOT willing to quit smoking  I have advised patient that we can assist and have options of Nicotine replacement therapy. I also advised patient on behavioral therapy and can provide oral medication therapy in conjunction with the other therapies  Follow up next Office visit  for assessment of smoking cessation  Smoking cessation counseling advised for 4 minutes     PAST MEDICAL HISTORY :   has a past medical history of Decreased libido, Heart murmur, Obesity, and Sleep apnea in adult.  has a past surgical history that includes Carpal tunnel release and Carpal tunnel release (Right). Prior to Admission medications   Medication Sig Start Date End Date Taking? Authorizing Provider  aspirin EC 81 MG tablet Take 1 tablet (81 mg total) by mouth  daily. 09/28/14  Yes [place] OuchArida, Muhammad A, MD   No Known Allergies  FAMILY HISTORY:  family history includes Hypertension in his brother and mother; Sleep disorder in his father. SOCIAL HISTORY:  reports that he has been smoking cigarettes.  He has a 2.25 pack-year smoking history. He has never used smokeless tobacco. He reports that he drinks alcohol. He reports that he does not use drugs.  REVIEW OF SYSTEMS:   Constitutional: +malaise/fatigue  HENT: Negative for hearing loss, ear pain, nosebleeds, congestion, sore throat, neck pain, tinnitus and ear discharge.   Eyes: Negative for blurred vision, double vision, photophobia, pain, discharge and redness.  Respiratory: Negative for cough, hemoptysis, sputum production, shortness of breath, wheezing and stridor.   Cardiovascular: Negative for chest pain, palpitations, orthopnea, claudication, leg swelling and PND.  Gastrointestinal: Negative for heartburn, nausea, vomiting, abdominal pain, diarrhea, constipation, blood in stool and melena.  Genitourinary: Negative for dysuria, urgency, frequency, hematuria and flank pain.  Musculoskeletal: Negative for myalgias, back pain, joint pain and falls.  Skin: Negative for itching and rash.  Neurological: Negative for dizziness, tingling, tremors, sensory change, speech change, focal weakness, seizures, loss of consciousness, weakness and headaches.  Endo/Heme/Allergies: Negative for environmental allergies and polydipsia. Does not bruise/bleed easily.  ALL OTHER ROS ARE NEGATIVE   BP 118/64 (BP Location: Left Arm, Cuff Size: Large)   Pulse 74   Resp 16   Ht 5\' 6"  (1.676 m)   Wt 259 lb (117.5 kg)   SpO2 99%   BMI 41.80 kg/m    Physical Examination:  GENERAL:NAD, no fevers, chills, no weakness no fatigue HEAD: Normocephalic, atraumatic.  EYES: Pupils equal, round, reactive to light. Extraocular muscles intact. No scleral icterus.  MOUTH: Moist mucosal membrane.   EAR, NOSE, THROAT: Clear  without exudates. No external lesions.  NECK: Supple. No thyromegaly. No nodules. No JVD.  PULMONARY:CTA B/L no wheezes, no crackles, no rhonchi CARDIOVASCULAR: S1 and S2. Regular rate and rhythm. No murmurs, rubs, or gallops. No edema.  GASTROINTESTINAL: Soft, nontender, nondistended. No masses. Positive bowel sounds.  MUSCULOSKELETAL: No swelling, clubbing, or edema. Range of motion full in all extremities.  NEUROLOGIC: Cranial nerves II through XII are intact. No gross focal neurological deficits.  SKIN: No ulceration, lesions, rashes, or cyanosis. Skin warm and dry. Turgor intact.  PSYCHIATRIC: Mood, affect within normal limits. The patient is awake, alert and oriented x 3. Insight, judgment intact.      ASSESSMENT / PLAN: 47 year old pleasant African-American male with a diagnosis of sleep apnea with progressive excessive daytime sleepiness with a non-refreshed sleep in the setting of positive tobacco abuse with obesity and deconditioned state  1.At this time patient will need a sleep study to reassess her sleep apnea 2.I have recommended smoking cessation 3.Obesity -recommend significant weight loss -recommend changing diet  4.Deconditioned state -Recommend increased daily activity and exercise    Patient  satisfied with Plan of action and management. All questions answered Follow-up when tests are completed   Lucie Leather, M.D.  [facility]  Medical Director [place] [facility] Medical Director [facility]

## 2017-08-02 NOTE — Patient Instructions (Signed)
Patient needs sleep study to assess sleep apnea  Recommend stop smoking

## 2017-08-31 ENCOUNTER — Encounter: Payer: Self-pay | Admitting: Internal Medicine

## 2017-08-31 ENCOUNTER — Ambulatory Visit: Payer: BLUE CROSS/BLUE SHIELD

## 2017-08-31 VITALS — BP 122/82 | HR 73

## 2017-08-31 DIAGNOSIS — G4719 Other hypersomnia: Secondary | ICD-10-CM

## 2017-08-31 DIAGNOSIS — R030 Elevated blood-pressure reading, without diagnosis of hypertension: Secondary | ICD-10-CM

## 2017-09-01 LAB — COMPLETE METABOLIC PANEL WITH GFR
AG Ratio: 1.7 (calc) (ref 1.0–2.5)
ALKALINE PHOSPHATASE (APISO): 68 U/L (ref 40–115)
ALT: 24 U/L (ref 9–46)
AST: 18 U/L (ref 10–40)
Albumin: 4.2 g/dL (ref 3.6–5.1)
BILIRUBIN TOTAL: 0.4 mg/dL (ref 0.2–1.2)
BUN: 13 mg/dL (ref 7–25)
CHLORIDE: 107 mmol/L (ref 98–110)
CO2: 24 mmol/L (ref 20–32)
Calcium: 9.6 mg/dL (ref 8.6–10.3)
Creat: 0.95 mg/dL (ref 0.60–1.35)
GFR, Est African American: 110 mL/min/{1.73_m2} (ref >=60)
GFR, Est Non African American: 95 mL/min/{1.73_m2} (ref >=60)
GLUCOSE: 121 mg/dL — AB (ref 65–99)
Globulin: 2.5 g/dL (calc) (ref 1.9–3.7)
Potassium: 4.7 mmol/L (ref 3.5–5.3)
SODIUM: 139 mmol/L (ref 135–146)
Total Protein: 6.7 g/dL (ref 6.1–8.1)

## 2017-09-01 LAB — HEMOGLOBIN A1C
Hgb A1c MFr Bld: 6.3 % of total Hgb — ABNORMAL HIGH (ref <5.7)
Mean Plasma Glucose: 134 (calc)
eAG (mmol/L): 7.4 (calc)

## 2017-09-01 LAB — VITAMIN D 25 HYDROXY (VIT D DEFICIENCY, FRACTURES): Vit D, 25-Hydroxy: 15 ng/mL — ABNORMAL LOW (ref 30–100)

## 2017-09-07 ENCOUNTER — Telehealth: Payer: Self-pay | Admitting: *Deleted

## 2017-09-07 DIAGNOSIS — G4733 Obstructive sleep apnea (adult) (pediatric): Secondary | ICD-10-CM

## 2017-09-07 NOTE — Telephone Encounter (Signed)
LMTCB  Recommend in lab titration study with change to bipap if indicated. 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

## 2017-09-07 NOTE — Telephone Encounter (Signed)
Pt aware. Orders placed. 

## 2017-09-13 ENCOUNTER — Other Ambulatory Visit: Payer: Self-pay | Admitting: Family Medicine

## 2017-09-13 MED ORDER — VITAMIN D (ERGOCALCIFEROL) 1.25 MG (50000 UNIT) PO CAPS: 50000.0000 [IU] | ORAL_CAPSULE | ORAL | 1 refills | Status: AC

## 2017-09-13 MED ORDER — METFORMIN HCL ER 500 MG PO TB24: ORAL_TABLET | ORAL | 0 refills | Status: DC

## 2017-09-13 NOTE — Progress Notes (Signed)
Offered to start metformin Start Rx vit D

## 2017-09-26 ENCOUNTER — Ambulatory Visit: Payer: BLUE CROSS/BLUE SHIELD | Attending: Internal Medicine

## 2017-09-26 DIAGNOSIS — G4733 Obstructive sleep apnea (adult) (pediatric): Secondary | ICD-10-CM | POA: Diagnosis not present

## 2017-09-28 DIAGNOSIS — G4733 Obstructive sleep apnea (adult) (pediatric): Secondary | ICD-10-CM

## 2017-10-02 ENCOUNTER — Telehealth: Payer: Self-pay | Admitting: *Deleted

## 2017-10-02 DIAGNOSIS — G4733 Obstructive sleep apnea (adult) (pediatric): Secondary | ICD-10-CM

## 2017-10-02 NOTE — Telephone Encounter (Signed)
LM that we have cpap pressures to place orders.

## 2017-10-03 NOTE — Telephone Encounter (Signed)
Pt aware Orders placed Nothing further needed. 

## 2017-10-25 ENCOUNTER — Ambulatory Visit: Payer: BLUE CROSS/BLUE SHIELD | Admitting: Internal Medicine

## 2017-12-23 DIAGNOSIS — G4733 Obstructive sleep apnea (adult) (pediatric): Secondary | ICD-10-CM | POA: Diagnosis not present

## 2018-01-22 ENCOUNTER — Ambulatory Visit: Payer: BLUE CROSS/BLUE SHIELD | Admitting: Family Medicine

## 2018-01-23 DIAGNOSIS — G4733 Obstructive sleep apnea (adult) (pediatric): Secondary | ICD-10-CM | POA: Diagnosis not present

## 2018-01-29 ENCOUNTER — Ambulatory Visit: Payer: BLUE CROSS/BLUE SHIELD | Admitting: Family Medicine

## 2018-01-29 ENCOUNTER — Encounter: Payer: Self-pay | Admitting: Family Medicine

## 2018-01-29 VITALS — BP 126/80 | HR 82 | Temp 98.3°F | Resp 12 | Ht 66.0 in | Wt 254.0 lb

## 2018-01-29 DIAGNOSIS — G8929 Other chronic pain: Secondary | ICD-10-CM

## 2018-01-29 DIAGNOSIS — L602 Onychogryphosis: Secondary | ICD-10-CM

## 2018-01-29 DIAGNOSIS — E119 Type 2 diabetes mellitus without complications: Secondary | ICD-10-CM | POA: Diagnosis not present

## 2018-01-29 DIAGNOSIS — G4733 Obstructive sleep apnea (adult) (pediatric): Secondary | ICD-10-CM | POA: Diagnosis not present

## 2018-01-29 DIAGNOSIS — B353 Tinea pedis: Secondary | ICD-10-CM | POA: Diagnosis not present

## 2018-01-29 DIAGNOSIS — L84 Corns and callosities: Secondary | ICD-10-CM

## 2018-01-29 DIAGNOSIS — M79672 Pain in left foot: Secondary | ICD-10-CM

## 2018-01-29 DIAGNOSIS — E559 Vitamin D deficiency, unspecified: Secondary | ICD-10-CM

## 2018-01-29 DIAGNOSIS — Z5181 Encounter for therapeutic drug level monitoring: Secondary | ICD-10-CM

## 2018-01-29 NOTE — Assessment & Plan Note (Signed)
Next A1c is actually due March 1st;

## 2018-01-29 NOTE — Progress Notes (Signed)
BP 126/80   Pulse 82   Temp 98.3 F (36.8 C) (Oral)   Resp 12   Ht 5\' 6"  (1.676 m)   Wt 254 lb (115.2 kg)   SpO2 97%   BMI 41.00 kg/m    Subjective:    Patient ID: Alvin Green, male    DOB: [birth year], 48 y.o.   MRN: [id number]  HPI: Alvin Green is a 48 y.o. male  Chief Complaint  Patient presents with  . Follow-up    HPI He is here for f/u Having pain in the left foot; two months duration; ice or hot bath will help; normally when sitting for a while; walking is fine; broke an ankle, but so many years ago, not sure if left or right; no surgeries in the past; also using the tolfnatate for the tinea and working well; thick nails, calluses on the feet (right >> left) and they are painful; wearing good supportive shoes  OSA; using CPAP with good results; managed by pulmonologist, Dr. 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, gets some walking too  Morbid obesity; eating healthier; family held accountable over the holidays; drinking enough water  Depression screen [facility] 2/9 01/29/2018 07/20/2017 03/15/2017 01/09/2017 12/14/2016  Decreased Interest 0 0 0 0 0  Down, Depressed, Hopeless 0 0 0 0 0  PHQ - 2 Score 0 0 0 0 0  Altered sleeping 0 - - - -  Tired, decreased energy 0 - - - -  Change in appetite 0 - - - -  Feeling bad or failure about yourself  0 - - - -  Trouble concentrating 0 - - - -  Moving slowly or fidgety/restless 0 - - - -  Suicidal thoughts 0 - - - -  PHQ-9 Score 0 - - - -  Difficult doing work/chores Not difficult at all - - - -   Fall Risk  01/29/2018 07/20/2017 03/15/2017 01/09/2017  12/14/2016  Falls in the past year? 0 No No No No  Number falls in past yr: 0 - - - -  Injury with Fall? 0 - - - -    Relevant past medical, surgical, family and social history reviewed Past Medical History:  Diagnosis Date  . Decreased libido   . Heart murmur   . Obesity   . Sleep apnea in adult    Past Surgical History:  Procedure Laterality Date  . CARPAL TUNNEL RELEASE    . CARPAL TUNNEL RELEASE Right    approximately 10 years ago   Family History  Problem Relation Age of Onset  . Hypertension Mother   . Sleep disorder Father   . Hypertension Brother    Social History   Tobacco Use  . Smoking status: Current Every Day Smoker    Packs/day: 0.50    Years: 9.00    Pack years: 4.50    Types: Cigarettes  . Smokeless tobacco: Never Used  Substance Use Topics  . Alcohol use: Yes    Alcohol/week: 0.0 standard drinks    Comment: occasionally  . Drug use: No  [facility]  AUDIT-C Score  1      Interim medical history since last visit reviewed. Allergies and medications reviewed  Review of Systems Per HPI unless specifically indicated above     Objective:    BP 126/80   Pulse 82   Temp 98.3 F (36.8 C) (Oral)   Resp 12   Ht 5\' 6"  (1.676 m)   Wt 254 lb (115.2 kg)   SpO2 97%   BMI 41.00 kg/m   Wt Readings from Last 3 Encounters:  01/29/18 254 lb (115.2 kg)  08/02/17 259 lb (117.5 kg)  07/20/17 261 lb 4.8 oz (118.5 kg)    Physical Exam Constitutional:      General: He is not in acute distress.    Appearance: He is well-developed. He is obese.     Comments: Weight loss over last 6 months noted and acknowledged with patient  HENT:     Head: Normocephalic and atraumatic.  Eyes:     General: No scleral icterus. Neck:     Thyroid: No thyromegaly.  Cardiovascular:     Rate and Rhythm: Normal rate and regular rhythm.  Pulmonary:     Effort: Pulmonary effort is normal.     Breath sounds: Normal  breath sounds.  Abdominal:     General: Bowel sounds are normal. There is no distension.     Palpations: Abdomen is soft.  Musculoskeletal:     Left foot: Tenderness present. No swelling, crepitus or deformity.       Feet:  Skin:    General: Skin is warm and dry.     Coloration: Skin is not pale.  Neurological:     Mental Status: He is alert.     Coordination: Coordination normal.  Psychiatric:        Behavior: Behavior normal.        Thought Content: Thought content normal.        Judgment: Judgment normal.    Diabetic Foot Form - Detailed   Diabetic Foot Exam - detailed Diabetic Foot exam was performed with the following findings:  Yes 01/29/2018  1:29 PM  Visual Foot Exam completed.:  Yes  Pulse Foot Exam completed.:  Yes  Right Dorsalis Pedis:  Present Left Dorsalis Pedis:  Present  Sensory Foot Exam Completed.:  Yes Semmes-Weinstein Monofilament Test R Site 1-Great Toe:  Pos L Site 1-Great Toe:  Pos    Comments:  Calluses right plantar surface >> left plantar surface; no active ulceration, but the ones on the right foot appear pre-ulcerative     Results for orders placed or performed in visit on 07/20/17  Hemoglobin A1C  Result Value Ref Range   Hgb A1c MFr Bld 6.3 (H) <5.7 % of total Hgb   Mean Plasma Glucose 134 (calc)   eAG (mmol/L) 7.4 (calc)  COMPLETE METABOLIC PANEL WITH GFR  Result Value Ref Range   Glucose, Bld 121 (H) 65 - 99 mg/dL   BUN 13 7 - 25 mg/dL   Creat 4.540.95 0.980.60 - 1.191.35 mg/dL   GFR, Est Non African American 95 > OR = 60 mL/min/1.2673m2   GFR, Est African American 110 > OR = 60 mL/min/1.7673m2   BUN/Creatinine Ratio NOT APPLICABLE 6 - 22 (calc)   Sodium 139 135 - 146 mmol/L   Potassium 4.7 3.5 - 5.3 mmol/L   Chloride 107 98 - 110 mmol/L   CO2 24 20 - 32 mmol/L   Calcium 9.6 8.6 -  10.3 mg/dL   Total Protein 6.7 6.1 - 8.1 g/dL   Albumin 4.2 3.6 - 5.1 g/dL   Globulin 2.5 1.9 - 3.7 g/dL (calc)   AG Ratio 1.7 1.0 - 2.5 (calc)   Total Bilirubin 0.4  0.2 - 1.2 mg/dL   Alkaline phosphatase (APISO) 68 40 - 115 U/L   AST 18 10 - 40 U/L   ALT 24 9 - 46 U/L  VITAMIN D 25 Hydroxy (Vit-D Deficiency, Fractures)  Result Value Ref Range   Vit D, 25-Hydroxy 15 (L) 30 - 100 ng/mL      Assessment & Plan:   Problem List Items Addressed This Visit      Respiratory   Obstructive sleep apnea     Endocrine   Type 2 diabetes mellitus (HCC) - Primary (Chronic)    Next A1c is actually due March 1st;       Relevant Orders   Ambulatory referral to Podiatry   Lipid panel   Microalbumin / creatinine urine ratio   Hemoglobin A1c     Other   Vitamin D deficiency    Check in March and supplement if needed; sun exposure favored over pills      Relevant Orders   VITAMIN D 25 Hydroxy (Vit-D Deficiency, Fractures)   Morbid obesity (HCC) (Chronic)    Down 7+ pounds over the last 6 months; not especially interested in dietician appointment; see AVS       Other Visit Diagnoses    Tinea pedis of right foot       Chronic foot pain, left       Relevant Orders   Ambulatory referral to Podiatry   Callus of foot       Relevant Orders   Ambulatory referral to Podiatry   Onychogryphosis       Relevant Orders   Ambulatory referral to Podiatry   Medication monitoring encounter       Relevant Orders   COMPLETE METABOLIC PANEL WITH GFR       Follow up plan: Return in about 6 months (around 07/30/2018) for follow-up visit with Dr. Sherie DonLada.  An after-visit summary was printed and given to the patient at check-out.  Please see the patient instructions which may contain other information and recommendations beyond what is mentioned above in the assessment and plan.  No orders of the defined types were placed in this encounter.   Orders Placed This Encounter  Procedures  . Lipid panel  . Microalbumin / creatinine urine ratio  . Hemoglobin A1c  . COMPLETE METABOLIC PANEL WITH GFR  . VITAMIN D 25 Hydroxy (Vit-D Deficiency, Fractures)  . Ambulatory  referral to Podiatry

## 2018-01-29 NOTE — Assessment & Plan Note (Signed)
Check in March and supplement if needed; sun exposure favored over pills

## 2018-01-29 NOTE — Patient Instructions (Addendum)
Check out the information at familydoctor.org entitled "Nutrition for Weight Loss: What You Need to Know about Fad Diets" Try to lose between 1-2 pounds per week by taking in fewer calories and burning off more calories You can succeed by limiting portions, limiting foods dense in calories and fat, becoming more active, and drinking 8 glasses of water a day (64 ounces) Don't skip meals, especially breakfast, as skipping meals may alter your metabolism Do not use over-the-counter weight loss pills or gimmicks that claim rapid weight loss A healthy BMI (or body mass index) is between 18.5 and 24.9 You can calculate your ideal BMI at the NIH website http://www.nhlbi.nih.gov/health/educational/lose_wt/BMI/bmicalc.htm  Obesity, Adult Obesity is the condition of having too much total body fat. Being overweight or obese means that your weight is greater than what is considered healthy for your body size. Obesity is determined by a measurement called BMI. BMI is an estimate of body fat and is calculated from height and weight. For adults, a BMI of 30 or higher is considered obese. Obesity can eventually lead to other health concerns and major illnesses, including:  Stroke.  Coronary artery disease (CAD).  Type 2 diabetes.  Some types of cancer, including cancers of the colon, breast, uterus, and gallbladder.  Osteoarthritis.  High blood pressure (hypertension).  High cholesterol.  Sleep apnea.  Gallbladder stones.  Infertility problems. What are the causes? The main cause of obesity is taking in (consuming) more calories than your body uses for energy. Other factors that contribute to this condition may include:  Being born with genes that make you more likely to become obese.  Having a medical condition that causes obesity. These conditions include: ? Hypothyroidism. ? Polycystic ovarian syndrome (PCOS). ? Binge-eating disorder. ? Cushing syndrome.  Taking certain medicines, such  as steroids, antidepressants, and seizure medicines.  Not being physically active (sedentary lifestyle).  Living where there are limited places to exercise safely or buy healthy foods.  Not getting enough sleep. What increases the risk? The following factors may increase your risk of this condition:  Having a family history of obesity.  Being a woman of African-American descent.  Being a man of Hispanic descent. What are the signs or symptoms? Having excessive body fat is the main symptom of this condition. How is this diagnosed? This condition may be diagnosed based on:  Your symptoms.  Your medical history.  A physical exam. Your health care provider may measure: ? Your BMI. If you are an adult with a BMI between 25 and less than 30, you are considered overweight. If you are an adult with a BMI of 30 or higher, you are considered obese. ? The distances around your hips and your waist (circumferences). These may be compared to each other to help diagnose your condition. ? Your skinfold thickness. Your health care provider may gently pinch a fold of your skin and measure it. How is this treated? Treatment for this condition often includes changing your lifestyle. Treatment may include some or all of the following:  Dietary changes. Work with your health care provider and a dietitian to set a weight-loss goal that is healthy and reasonable for you. Dietary changes may include eating: ? Smaller portions. A portion size is the amount of a particular food that is healthy for you to eat at one time. This varies from person to person. ? Low-calorie or low-fat options. ? More whole grains, fruits, and vegetables.  Regular physical activity. This may include aerobic activity (cardio) and   strength training.  Medicine to help you lose weight. Your health care provider may prescribe medicine if you are unable to lose 1 pound a week after 6 weeks of eating more healthily and doing more  physical activity.  Surgery. Surgical options may include gastric banding and gastric bypass. Surgery may be done if: ? Other treatments have not helped to improve your condition. ? You have a BMI of 40 or higher. ? You have life-threatening health problems related to obesity. Follow these instructions at home:  Eating and drinking   Follow recommendations from your health care provider about what you eat and drink. Your health care provider may advise you to: ? Limit fast foods, sweets, and processed snack foods. ? Choose low-fat options, such as low-fat milk instead of whole milk. ? Eat 5 or more servings of fruits or vegetables every day. ? Eat at home more often. This gives you more control over what you eat. ? Choose healthy foods when you eat out. ? Learn what a healthy portion size is. ? Keep low-fat snacks on hand. ? Avoid sugary drinks, such as soda, fruit juice, iced tea sweetened with sugar, and flavored milk. ? Eat a healthy breakfast.  Drink enough water to keep your urine clear or pale yellow.  Do not go without eating for [place] periods of time (do not fast) or follow a fad diet. Fasting and fad diets can be unhealthy and even dangerous. Physical Activity  Exercise regularly, as told by your health care provider. Ask your health care provider what types of exercise are safe for you and how often you should exercise.  Warm up and stretch before being active.  Cool down and stretch after being active.  Rest between periods of activity. Lifestyle  Limit the time that you spend in front of your TV, computer, or video game system.  Find ways to reward yourself that do not involve food.  Limit alcohol intake to no more than 1 drink a day for nonpregnant women and 2 drinks a day for men. One drink equals 12 oz of beer, 5 oz of wine, or 1 oz of hard liquor. General instructions  Keep a weight loss journal to keep track of the food you eat and how much you exercise you  get.  Take over-the-counter and prescription medicines only as told by your health care provider.  Take vitamins and supplements only as told by your health care provider.  Consider joining a support group. Your health care provider may be able to recommend a support group.  Keep all follow-up visits as told by your health care provider. This is important. Contact a health care provider if:  You are unable to meet your weight loss goal after 6 weeks of dietary and lifestyle changes. This information is not intended to replace advice given to you by your health care provider. Make sure you discuss any questions you have with your health care provider. Document Released: 01/27/2004 Document Revised: 05/24/2015 Document Reviewed: 10/07/2014 Elsevier Interactive Patient Education  2019 Elsevier Inc.  Preventing Unhealthy Weight Gain, Adult Staying at a healthy weight is important to your overall health. When fat builds up in your body, you may become overweight or obese. Being overweight or obese increases your risk of developing certain health problems, such as heart disease, diabetes, sleeping problems, joint problems, and some types of cancer. Unhealthy weight gain is often the result of making unhealthy food choices or not getting enough exercise. You can make changes   to your lifestyle to prevent obesity and stay as healthy as possible. What nutrition changes can be made?   Eat only as much as your body needs. To do this: ? Pay attention to signs that you are hungry or full. Stop eating as soon as you feel full. ? If you feel hungry, try drinking water first before eating. Drink enough water so your urine is clear or pale yellow. ? Eat smaller portions. Pay attention to portion sizes when eating out. ? Look at serving sizes on food labels. Most foods contain more than one serving per container. ? Eat the recommended number of calories for your gender and activity level. For most active  people, a daily total of 2,000 calories is appropriate. If you are trying to lose weight or are not very active, you may need to eat fewer calories. Talk with your health care provider or a diet and nutrition specialist (dietitian) about how many calories you need each day.  Choose healthy foods, such as: ? Fruits and vegetables. At each meal, try to fill at least half of your plate with fruits and vegetables. ? Whole grains, such as whole-wheat bread, brown rice, and quinoa. ? Lean meats, such as chicken or fish. ? Other healthy proteins, such as beans, eggs, or tofu. ? Healthy fats, such as nuts, seeds, fatty fish, and olive oil. ? Low-fat or fat-free dairy products.  Check food labels, and avoid food and drinks that: ? Are high in calories. ? Have added sugar. ? Are high in sodium. ? Have saturated fats or trans fats.  Cook foods in healthier ways, such as by baking, broiling, or grilling.  Make a meal plan for the week, and shop with a grocery list to help you stay on track with your purchases. Try to avoid going to the grocery store when you are hungry.  When grocery shopping, try to shop around the outside of the store first, where the fresh foods are. Doing this helps you to avoid prepackaged foods, which can be high in sugar, salt (sodium), and fat. What lifestyle changes can be made?   Exercise for 30 or more minutes on 5 or more days each week. Exercising may include brisk walking, yard work, biking, running, swimming, and team sports like basketball and soccer. Ask your health care provider which exercises are safe for you.  Do muscle-strengthening activities, such as lifting weights or using resistance bands, on 2 or more days a week.  Do not use any products that contain nicotine or tobacco, such as cigarettes and e-cigarettes. If you need help quitting, ask your health care provider.  Limit alcohol intake to no more than 1 drink a day for nonpregnant women and 2 drinks a  day for men. One drink equals 12 oz of beer, 5 oz of wine, or 1 oz of hard liquor.  Try to get 7-9 hours of sleep each night. What other changes can be made?  Keep a food and activity journal to keep track of: ? What you ate and how many calories you had. Remember to count the calories in sauces, dressings, and side dishes. ? Whether you were active, and what exercises you did. ? Your calorie, weight, and activity goals.  Check your weight regularly. Track any changes. If you notice you have gained weight, make changes to your diet or activity routine.  Avoid taking weight-loss medicines or supplements. Talk to your health care provider before starting any new medicine or supplement.  Talk   to your health care provider before trying any new diet or exercise plan. Why are these changes important? Eating healthy, staying active, and having healthy habits can help you to prevent obesity. Those changes also:  Help you manage stress and emotions.  Help you connect with friends and family.  Improve your self-esteem.  Improve your sleep.  Prevent [place]-term health problems. What can happen if changes are not made? Being obese or overweight can cause you to develop joint or bone problems, which can make it hard for you to stay active or do activities you enjoy. Being obese or overweight also puts stress on your heart and lungs and can lead to health problems like diabetes, heart disease, and some cancers. Where to find more information Talk with your health care provider or a dietitian about healthy eating and healthy lifestyle choices. You may also find information from:  U.S. Department of Agriculture, MyPlate: www.choosemyplate.gov  American Heart Association: www.heart.org  Centers for Disease Control and Prevention: www.cdc.gov Summary  Staying at a healthy weight is important to your overall health. It helps you to prevent certain diseases and health problems, such as heart  disease, diabetes, joint problems, sleep disorders, and some types of cancer.  Being obese or overweight can cause you to develop joint or bone problems, which can make it hard for you to stay active or do activities you enjoy.  You can prevent unhealthy weight gain by eating a healthy diet, exercising regularly, not smoking, limiting alcohol, and getting enough sleep.  Talk with your health care provider or a dietitian for guidance about healthy eating and healthy lifestyle choices. This information is not intended to replace advice given to you by your health care provider. Make sure you discuss any questions you have with your health care provider. Document Released: 12/21/2015 Document Revised: 09/29/2016 Document Reviewed: 01/26/2016 Elsevier Interactive Patient Education  2019 Elsevier Inc.  

## 2018-01-29 NOTE — Assessment & Plan Note (Signed)
Down 7+ pounds over the last 6 months; not especially interested in dietician appointment; see AVS

## 2018-02-19 ENCOUNTER — Other Ambulatory Visit: Payer: Self-pay | Admitting: Podiatry

## 2018-02-19 ENCOUNTER — Ambulatory Visit: Payer: BLUE CROSS/BLUE SHIELD | Admitting: Podiatry

## 2018-02-19 ENCOUNTER — Ambulatory Visit (INDEPENDENT_AMBULATORY_CARE_PROVIDER_SITE_OTHER): Payer: BLUE CROSS/BLUE SHIELD

## 2018-02-19 ENCOUNTER — Encounter: Payer: Self-pay | Admitting: Podiatry

## 2018-02-19 VITALS — BP 162/105 | HR 69

## 2018-02-19 DIAGNOSIS — L989 Disorder of the skin and subcutaneous tissue, unspecified: Secondary | ICD-10-CM

## 2018-02-19 DIAGNOSIS — M659 Synovitis and tenosynovitis, unspecified: Secondary | ICD-10-CM | POA: Diagnosis not present

## 2018-02-19 DIAGNOSIS — M7752 Other enthesopathy of left foot: Secondary | ICD-10-CM

## 2018-02-21 NOTE — Progress Notes (Signed)
   Subjective: 48 year old male presenting today as a new patient with a chief complaint of pain to the lateral aspect of the left ankle that began 2-3 months ago. He also notes painful callus lesions located on the feet bilaterally. Walking increases the pain of the calluses and the ankle. He has not done anything for treatment. Patient is here for further evaluation and treatment.   Past Medical History:  Diagnosis Date  . Decreased libido   . Heart murmur   . Obesity   . Sleep apnea in adult      Objective:  Physical Exam General: Alert and oriented x3 in no acute distress  Dermatology: Hyperkeratotic lesions present on the bilateral feet x 4. Pain on palpation with a central nucleated core noted. Skin is warm, dry and supple bilateral lower extremities. Negative for open lesions or macerations.  Vascular: Palpable pedal pulses bilaterally. No edema or erythema noted. Capillary refill within normal limits.  Neurological: Epicritic and protective threshold grossly intact bilaterally.   Musculoskeletal Exam: Pain on palpation at the keratotic lesions noted as well as to the medial, lateral and anterior aspects of the left ankle joint. Range of motion within normal limits bilateral. Muscle strength 5/5 in all groups bilateral.  Radiographic Exam:  Normal osseous mineralization. Joint spaces preserved. No fracture/dislocation/boney destruction.    Assessment: 1. Ankle synovitis left 2. Porokeratosis bilateral feet x 4   Plan of Care:  1. Patient evaluated. X-Rays reviewed.  2. Excisional debridement of keratoic lesions using a chisel blade was performed without incident. Salinocaine applied.  3. Dressed area with light dressing. 4. Ankle brace dispensed for left ankle.  5. Injection of 0.5 mLs Celestone Soluspan injected into the left ankle joint.  6. Patient is to return to the clinic PRN.   Manager at [employer].   Felecia Shelling, DPM [facility]  Dr.  Felecia Shelling, DPM    [address]                                        [place], [place] [address]                Office [phone]  Fax [phone]

## 2018-02-23 DIAGNOSIS — G4733 Obstructive sleep apnea (adult) (pediatric): Secondary | ICD-10-CM | POA: Diagnosis not present

## 2018-02-28 ENCOUNTER — Telehealth: Payer: Self-pay | Admitting: Family Medicine

## 2018-02-28 NOTE — Telephone Encounter (Signed)
Patient's blood pressure was high at podiatry Please schedule him for a VISIT with me or Lanora Manis this week or next

## 2018-02-28 NOTE — Telephone Encounter (Signed)
lvm @ 8:26 asking pt to return call to schedule appt

## 2018-03-04 ENCOUNTER — Ambulatory Visit: Payer: BLUE CROSS/BLUE SHIELD | Admitting: Nurse Practitioner

## 2018-03-04 ENCOUNTER — Encounter: Payer: Self-pay | Admitting: Nurse Practitioner

## 2018-03-04 VITALS — BP 142/80 | HR 94 | Temp 98.8°F | Resp 16 | Ht 66.0 in | Wt 255.4 lb

## 2018-03-04 DIAGNOSIS — G4733 Obstructive sleep apnea (adult) (pediatric): Secondary | ICD-10-CM | POA: Diagnosis not present

## 2018-03-04 DIAGNOSIS — E119 Type 2 diabetes mellitus without complications: Secondary | ICD-10-CM

## 2018-03-04 DIAGNOSIS — R030 Elevated blood-pressure reading, without diagnosis of hypertension: Secondary | ICD-10-CM

## 2018-03-04 NOTE — Progress Notes (Signed)
Name: Alvin Green   MRN: [id number]    DOB: 11-Sep-1970   Date:03/04/2018       Progress Note  Subjective  Chief Complaint  Chief Complaint  Patient presents with  . Hypertension    patient stated that his BP was high at the podiatrist office last week.    HPI  Patient presents for follow-up on elevated blood pressure at podiatrist on 02/19/2018. BP was 162/105. Previous visit with PCP on 01/29/2018 BP was 126/80. He does have OSA- wears CPAP.   Hypertension  Siblings / family history: Does high blood pressure run in your family?   YES  Salt:  Trying to limit sodium / salt when buying foods at the grocery store?  yes Do you try to limit added salt when cooking and at the table?  NO  Sweets/licorice:  Do you eat a lot of sweets or eat black licorice?  no  Saturated fats: Do you eat a lot of foods like bacon, sausage, pepperoni, cheeseburgers, hot dogs, bologna, and cheese?  no does not eat pork, eats beef daily   Sedentary lifestyle:  Exercise/activity level:  sedentary (essential NO activity) parks far so he can walk, moves at walk   Steroids/Non-steroidals:  Have you had a recent cortisone shot in the last few months?  YES got steroid shot in foot; after BP was taken  Do you take prednisone or prescription NSAIDs or take OTCS NSAIDs such as ibuprofen, Motrin, Advil, Aleve, or naproxen? no   Smoking: Do you smoke?  YES used to smoke 2 pack week now about 3-4 pp a week; stress at work caused him to increase   Snoring / sleep apnea: Do you snore or have sleep apnea?  YES If diagnosed with sleep apnea, do you use your machine?  yes  Stress: Do you feel like you are under excessive stress or that your stress level affects your blood pressure at times?    YES some work stress- states has been upset this week because he did not get anticipated raise.   Stroh's (alcohol): Do you drink alcohol  yes states very rarely, basically stopped.   --- if yes, do you drink more than 14  drinks/week if male or more than 7 drinks/week if male?  no  Sudafed (decongestants): Do you use any OTC decongestant products like Allegra-D, Claritin-D, Zyrtec-D, Tylenol Cold and Sinus, etc.?  no   Patient Active Problem List   Diagnosis Date Noted  . Acquired trigger finger 08/02/2017  . Sprain of metacarpophalangeal joint 08/02/2017  . Obstructive sleep apnea 07/20/2017  . Elevated blood pressure reading 07/20/2017  . Vitamin D deficiency 03/15/2017  . Annual physical exam 10/02/2016  . Type 2 diabetes mellitus (HCC) 04/28/2016  . Morbid obesity (HCC) 09/28/2014    Past Medical History:  Diagnosis Date  . Decreased libido   . Heart murmur   . Obesity   . Sleep apnea in adult     Past Surgical History:  Procedure Laterality Date  . CARPAL TUNNEL RELEASE    . CARPAL TUNNEL RELEASE Right    approximately 10 years ago    Social History   Tobacco Use  . Smoking status: Current Every Day Smoker    Packs/day: 0.50    Years: 9.00    Pack years: 4.50    Types: Cigarettes  . Smokeless tobacco: Never Used  Substance Use Topics  . Alcohol use: Yes    Alcohol/week: 0.0 standard drinks    Comment: occasionally  Current Outpatient Medications:  .  aspirin EC 81 MG tablet, Take 1 tablet (81 mg total) by mouth daily., Disp: 90 tablet, Rfl: 0 .  metFORMIN (GLUCOPHAGE XR) 500 MG 24 hr tablet, One by mouth daily for one week, then two daily (Patient not taking: Reported on 03/04/2018), Disp: 83 tablet, Rfl: 0  No Known Allergies  ROS   No other specific complaints in a complete review of systems (except as listed in HPI above).  Objective  Vitals:   03/04/18 1332  BP: (!) 142/80  Pulse: 94  Resp: 16  Temp: 98.8 F (37.1 C)  TempSrc: Oral  SpO2: 97%  Weight: 255 lb 6.4 oz (115.8 kg)  Height: 5\' 6"  (1.676 m)    Body mass index is 41.22 kg/m.  Nursing Note and Vital Signs reviewed.  Physical Exam Vitals signs reviewed.  Constitutional:       Appearance: He is well-developed.  HENT:     Head: Normocephalic and atraumatic.  Neck:     Musculoskeletal: Normal range of motion and neck supple.     Vascular: No carotid bruit.  Cardiovascular:     Heart sounds: Normal heart sounds.  Pulmonary:     Effort: Pulmonary effort is normal.     Breath sounds: Normal breath sounds.  Abdominal:     General: Bowel sounds are normal.     Palpations: Abdomen is soft.     Tenderness: There is no abdominal tenderness.  Musculoskeletal: Normal range of motion.  Skin:    General: Skin is warm and dry.     Capillary Refill: Capillary refill takes less than 2 seconds.  Neurological:     Mental Status: He is alert and oriented to person, place, and time.     GCS: GCS eye subscore is 4. GCS verbal subscore is 5. GCS motor subscore is 6.     Sensory: No sensory deficit.  Psychiatric:        Speech: Speech normal.        Behavior: Behavior normal.        Thought Content: Thought content normal.        Judgment: Judgment normal.      No results found for this or any previous visit (from the past 48 hour(s)).  Assessment & Plan 1. Elevated blood pressure reading Patient's blood pressure is elevated today, however it is much improved since podiatrist office.  States he had increased stress going into podiatry office due to just finding out he had lost opportunity for raise and he was rushing to office.  Denies any associated symptoms.  Discussed starting him on ACEi or ARB due to diabetes.  Patient states he has actually been able to come off metformin due to strict diet control.  Would like to try working hard on diet and weight loss to bring blood pressure down.  Discussed risks and benefits agreeable to this plan will follow-up in 1 month to reevaluate.  2. Type 2 diabetes mellitus without complication, without Calcaterra-term current use of insulin (HCC) States is just taking metformin as needed as he has been able to bring down his blood sugar with  strict diet control  3. Morbid obesity (HCC) Diet and increase activity  4. Obstructive sleep apnea Continue to wear CPAP as prescribed      -Red flags and when to present for emergency care or RTC including fever >101.24F, chest pain, shortness of breath, new/worsening/un-resolving symptoms,  reviewed with patient at time of visit. Follow up and  care instructions discussed and provided in AVS. Face-to-face time with patient was more than 25 minutes, >50% time spent counseling and coordination of care

## 2018-03-04 NOTE — Patient Instructions (Addendum)
Your goal blood pressure is less than 140 mmHg on top. Try to follow the DASH guidelines (DASH stands for Dietary Approaches to Stop Hypertension) Try to limit the sodium in your diet.  Ideally, consume less than 1.5 grams (less than 1,500mg ) per day. Do not add salt when cooking or at the table.  Check the sodium amount on labels when shopping, and choose items lower in sodium when given a choice. Avoid or limit foods that already contain a lot of sodium. Eat a diet rich in fruits and vegetables and whole grains. - Some quit smoking apps: Hotel manager. Kwit. Get Rich or Die Smoking. SmokeFree. Quit Tracker.  EasyQuit.    DASH Eating Plan DASH stands for "Dietary Approaches to Stop Hypertension." The DASH eating plan is a healthy eating plan that has been shown to reduce high blood pressure (hypertension). It may also reduce your risk for type 2 diabetes, heart disease, and stroke. The DASH eating plan may also help with weight loss. What are tips for following this plan?  General guidelines  Avoid eating more than 2,300 mg (milligrams) of salt (sodium) a day. If you have hypertension, you may need to reduce your sodium intake to 1,500 mg a day.  Limit alcohol intake to no more than 1 drink a day for nonpregnant women and 2 drinks a day for men. One drink equals 12 oz of beer, 5 oz of wine, or 1 oz of hard liquor.  Work with your health care provider to maintain a healthy body weight or to lose weight. Ask what an ideal weight is for you.  Get at least 30 minutes of exercise that causes your heart to beat faster (aerobic exercise) most days of the week. Activities may include walking, swimming, or biking.  Work with your health care provider or diet and nutrition specialist (dietitian) to adjust your eating plan to your individual calorie needs. Reading food labels   Check food labels for the amount of sodium per serving. Choose foods with less than 5 percent of the Daily Value of  sodium. Generally, foods with less than 300 mg of sodium per serving fit into this eating plan.  To find whole grains, look for the word "whole" as the first word in the ingredient list. Shopping  Buy products labeled as "low-sodium" or "no salt added."  Buy fresh foods. Avoid canned foods and premade or frozen meals. Cooking  Avoid adding salt when cooking. Use salt-free seasonings or herbs instead of table salt or sea salt. Check with your health care provider or pharmacist before using salt substitutes.  Do not fry foods. Cook foods using healthy methods such as baking, boiling, grilling, and broiling instead.  Cook with heart-healthy oils, such as olive, canola, soybean, or sunflower oil. Meal planning  Eat a balanced diet that includes: ? 5 or more servings of fruits and vegetables each day. At each meal, try to fill half of your plate with fruits and vegetables. ? Up to 6-8 servings of whole grains each day. ? Less than 6 oz of lean meat, poultry, or fish each day. A 3-oz serving of meat is about the same size as a deck of cards. One egg equals 1 oz. ? 2 servings of low-fat dairy each day. ? A serving of nuts, seeds, or beans 5 times each week. ? Heart-healthy fats. Healthy fats called Omega-3 fatty acids are found in foods such as flaxseeds and coldwater fish, like sardines, salmon, and mackerel.  Limit how much  you eat of the following: ? Canned or prepackaged foods. ? Food that is high in trans fat, such as fried foods. ? Food that is high in saturated fat, such as fatty meat. ? Sweets, desserts, sugary drinks, and other foods with added sugar. ? Full-fat dairy products.  Do not salt foods before eating.  Try to eat at least 2 vegetarian meals each week.  Eat more home-cooked food and less restaurant, buffet, and fast food.  When eating at a restaurant, ask that your food be prepared with less salt or no salt, if possible. What foods are recommended? The items listed  may not be a complete list. Talk with your dietitian about what dietary choices are best for you. Grains Whole-grain or whole-wheat bread. Whole-grain or whole-wheat pasta. Brown rice. Orpah Cobbatmeal. Quinoa. Bulgur. Whole-grain and low-sodium cereals. Pita bread. Low-fat, low-sodium crackers. Whole-wheat flour tortillas. Vegetables Fresh or frozen vegetables (raw, steamed, roasted, or grilled). Low-sodium or reduced-sodium tomato and vegetable juice. Low-sodium or reduced-sodium tomato sauce and tomato paste. Low-sodium or reduced-sodium canned vegetables. Fruits All fresh, dried, or frozen fruit. Canned fruit in natural juice (without added sugar). Meat and other protein foods Skinless chicken or Malawiturkey. Ground chicken or Mal[place]iturkey. Pork with fat trimmed off. Fish and seafood. Egg whites. Dried beans, peas, or lentils. Unsalted nuts, nut butters, and seeds. Unsalted canned beans. Lean cuts of beef with fat trimmed off. Low-sodium, lean deli meat. Dairy Low-fat (1%) or fat-free (skim) milk. Fat-free, low-fat, or reduced-fat cheeses. Nonfat, low-sodium ricotta or cottage cheese. Low-fat or nonfat yogurt. Low-fat, low-sodium cheese. Fats and oils Soft margarine without trans fats. Vegetable oil. Low-fat, reduced-fat, or light mayonnaise and salad dressings (reduced-sodium). Canola, safflower, olive, soybean, and sunflower oils. Avocado. Seasoning and other foods Herbs. Spices. Seasoning mixes without salt. Unsalted popcorn and pretzels. Fat-free sweets. What foods are not recommended? The items listed may not be a complete list. Talk with your dietitian about what dietary choices are best for you. Grains Baked goods made with fat, such as croissants, muffins, or some breads. Dry pasta or rice meal packs. Vegetables Creamed or fried vegetables. Vegetables in a cheese sauce. Regular canned vegetables (not low-sodium or reduced-sodium). Regular canned tomato sauce and paste (not low-sodium or reduced-sodium).  Regular tomato and vegetable juice (not low-sodium or reduced-sodium). Rosita FirePickles. Olives. Fruits Canned fruit in a light or heavy syrup. Fried fruit. Fruit in cream or butter sauce. Meat and other protein foods Fatty cuts of meat. Ribs. Fried meat. Tomasa BlaseBacon. Sausage. Bologna and other processed lunch meats. Salami. Fatback. Hotdogs. Bratwurst. Salted nuts and seeds. Canned beans with added salt. Canned or smoked fish. Whole eggs or egg yolks. Chicken or Malawiturkey with skin. Dairy Whole or 2% milk, cream, and half-and-half. Whole or full-fat cream cheese. Whole-fat or sweetened yogurt. Full-fat cheese. Nondairy creamers. Whipped toppings. Processed cheese and cheese spreads. Fats and oils Butter. Stick margarine. Lard. Shortening. Ghee. Bacon fat. Tropical oils, such as coconut, palm kernel, or palm oil. Seasoning and other foods Salted popcorn and pretzels. Onion salt, garlic salt, seasoned salt, table salt, and sea salt. Worcestershire sauce. Tartar sauce. Barbecue sauce. Teriyaki sauce. Soy sauce, including reduced-sodium. Steak sauce. Canned and packaged gravies. Fish sauce. Oyster sauce. Cocktail sauce. Horseradish that you find on the shelf. Ketchup. Mustard. Meat flavorings and tenderizers. Bouillon cubes. Hot sauce and Tabasco sauce. Premade or packaged marinades. Premade or packaged taco seasonings. Relishes. Regular salad dressings. Where to find more information:  National Heart, Lung, and Blood Institute: PopSteam.iswww.nhlbi.nih.gov  American Heart Association: www.heart.org Summary  The DASH eating plan is a healthy eating plan that has been shown to reduce high blood pressure (hypertension). It may also reduce your risk for type 2 diabetes, heart disease, and stroke.  With the DASH eating plan, you should limit salt (sodium) intake to 2,300 mg a day. If you have hypertension, you may need to reduce your sodium intake to 1,500 mg a day.  When on the DASH eating plan, aim to eat more fresh fruits and  vegetables, whole grains, lean proteins, low-fat dairy, and heart-healthy fats.  Work with your health care provider or diet and nutrition specialist (dietitian) to adjust your eating plan to your individual calorie needs. This information is not intended to replace advice given to you by your health care provider. Make sure you discuss any questions you have with your health care provider. Document Released: 12/08/2010 Document Revised: 12/13/2015 Document Reviewed: 12/13/2015 Elsevier Interactive Patient Education  2019 [employer].

## 2018-03-24 DIAGNOSIS — G4733 Obstructive sleep apnea (adult) (pediatric): Secondary | ICD-10-CM | POA: Diagnosis not present

## 2018-04-04 ENCOUNTER — Ambulatory Visit: Payer: BLUE CROSS/BLUE SHIELD | Admitting: Family Medicine

## 2018-04-05 ENCOUNTER — Ambulatory Visit (INDEPENDENT_AMBULATORY_CARE_PROVIDER_SITE_OTHER): Payer: BLUE CROSS/BLUE SHIELD | Admitting: Family Medicine

## 2018-04-05 ENCOUNTER — Encounter: Payer: Self-pay | Admitting: Family Medicine

## 2018-04-05 DIAGNOSIS — E559 Vitamin D deficiency, unspecified: Secondary | ICD-10-CM

## 2018-04-05 DIAGNOSIS — Z720 Tobacco use: Secondary | ICD-10-CM

## 2018-04-05 DIAGNOSIS — E119 Type 2 diabetes mellitus without complications: Secondary | ICD-10-CM

## 2018-04-05 DIAGNOSIS — R030 Elevated blood-pressure reading, without diagnosis of hypertension: Secondary | ICD-10-CM

## 2018-04-05 DIAGNOSIS — G4733 Obstructive sleep apnea (adult) (pediatric): Secondary | ICD-10-CM

## 2018-04-05 MED ORDER — METFORMIN HCL ER 500 MG PO TB24: ORAL_TABLET | ORAL | 0 refills | Status: DC

## 2018-04-05 NOTE — Progress Notes (Signed)
There were no vitals taken for this visit.   Subjective:    Patient ID: Alvin Green, male    DOB: 09-02-1970, 48 y.o.   MRN: [id number]  HPI: Alvin Green is a 48 y.o. male  Chief Complaint  Patient presents with  . Follow-up    HPI Virtual Visit via Telephone Note   I connected with Alvin Green on April 05, 2018 at 8:08 am EDT by telephone and verified that I am speaking with the correct person using two identifiers.   Staff discussed the limitations, risks, security, and privacy concerns of performing an evaluation and management service by telephone and the availability of in-person appointments. Staff discussed with the patient that he/she may be responsible for charges related to this service. The patient expressed understanding and agreed to proceed. The patient was asked to check her surroundings, be sure to keep phone off of speaker, let me know immediately if someone comes near and our conversation has to be terminated or paused for privacy concerns.  Additional participants: none  Patient location: home Provider location: home  Call started: 8:08 am Call terminated: 8:26 Total length of call: 18 minutes and 18 seconds  Since the last visit, no medical excitement Overall health is good  Diabetes mellitus; he checks FSBS about once a week or two; average is around 112 No low sugars; no dry mouth, no blurred vision No one in the family has diabetes; no sugary drinks He was put on metformin; just takes periodically; willing to start back  Lab Results  Component Value Date   HGBA1C 6.3 (H) 08/31/2017  he'll come in later for labs  Vitamin D deficiency; he took his supplements in the fall; he gets some exposure  Sleep apnea; wearing CPAP; managed by Dr. Belia Heman; feels refreshed  Elevated blood pressure; not checking; not adding salt to his food any more; just what's already in there  Obesity; he weighed himself 2 weeks ago; 254 pounds;    Smoking; decreasing smoking recently; proud of patient  Depression screen [facility] 2/9 04/05/2018 03/04/2018 01/29/2018 07/20/2017 03/15/2017  Decreased Interest 0 0 0 0 0  Down, Depressed, Hopeless 0 0 0 0 0  PHQ - 2 Score 0 0 0 0 0  Altered sleeping 0 0 0 - -  Tired, decreased energy 0 0 0 - -  Change in appetite 0 0 0 - -  Feeling bad or failure about yourself  0 0 0 - -  Trouble concentrating 0 0 0 - -  Moving slowly or fidgety/restless 0 0 0 - -  Suicidal thoughts 0 0 0 - -  PHQ-9 Score 0 0 0 - -  Difficult doing work/chores Not difficult at all Not difficult at all Not difficult at all - -   Fall Risk  04/05/2018 03/04/2018 01/29/2018 07/20/2017 03/15/2017  Falls in the past year? 0 0 0 No No  Number falls in past yr: - 0 0 - -  Injury with Fall? - 0 0 - -    Relevant past medical, surgical, family and social history reviewed Past Medical History:  Diagnosis Date  . Decreased libido   . Heart murmur   . Obesity   . Sleep apnea in adult   . Type 2 diabetes mellitus (HCC) 04/28/2016   Past Surgical History:  Procedure Laterality Date  . CARPAL TUNNEL RELEASE    . CARPAL TUNNEL RELEASE Right    approximately 10 years ago   Family History  Problem  Relation Age of Onset  . Hypertension Mother   . Sleep disorder Father   . Hypertension Brother    Social History   Tobacco Use  . Smoking status: Current Every Day Smoker    Packs/day: 0.50    Years: 9.00    Pack years: 4.50    Types: Cigarettes  . Smokeless tobacco: Never Used  Substance Use Topics  . Alcohol use: Yes    Alcohol/week: 0.0 standard drinks    Comment: occasionally  . Drug use: No     Office Visit from 04/05/2018 in [facility]  AUDIT-C Score  0      Interim medical history since last visit reviewed. Allergies and medications reviewed  Review of Systems Per HPI unless specifically indicated above     Objective:    There were no vitals taken for this visit.  Wt Readings from Last 3  Encounters:  03/04/18 255 lb 6.4 oz (115.8 kg)  01/29/18 254 lb (115.2 kg)  08/02/17 259 lb (117.5 kg)    Physical Exam Pulmonary:     Effort: No respiratory distress.  Neurological:     Mental Status: He is alert.  Psychiatric:        Speech: Speech is not rapid and pressured, delayed or slurred.    Results for orders placed or performed in visit on 07/20/17  Hemoglobin A1C  Result Value Ref Range   Hgb A1c MFr Bld 6.3 (H) <5.7 % of total Hgb   Mean Plasma Glucose 134 (calc)   eAG (mmol/L) 7.4 (calc)  COMPLETE METABOLIC PANEL WITH GFR  Result Value Ref Range   Glucose, Bld 121 (H) 65 - 99 mg/dL   BUN 13 7 - 25 mg/dL   Creat 7.35 6.70 - 1.41 mg/dL   GFR, Est Non African American 95 > OR = 60 mL/min/1.3m2   GFR, Est African American 110 > OR = 60 mL/min/1.92m2   BUN/Creatinine Ratio NOT APPLICABLE 6 - 22 (calc)   Sodium 139 135 - 146 mmol/L   Potassium 4.7 3.5 - 5.3 mmol/L   Chloride 107 98 - 110 mmol/L   CO2 24 20 - 32 mmol/L   Calcium 9.6 8.6 - 10.3 mg/dL   Total Protein 6.7 6.1 - 8.1 g/dL   Albumin 4.2 3.6 - 5.1 g/dL   Globulin 2.5 1.9 - 3.7 g/dL (calc)   AG Ratio 1.7 1.0 - 2.5 (calc)   Total Bilirubin 0.4 0.2 - 1.2 mg/dL   Alkaline phosphatase (APISO) 68 40 - 115 U/L   AST 18 10 - 40 U/L   ALT 24 9 - 46 U/L  VITAMIN D 25 Hydroxy (Vit-D Deficiency, Fractures)  Result Value Ref Range   Vit D, 25-Hydroxy 15 (L) 30 - 100 ng/mL      Assessment & Plan:   Problem List Items Addressed This Visit      Respiratory   Obstructive sleep apnea    Continue to wear CPAP, managed by pulm        Endocrine   Type 2 diabetes mellitus (HCC) (Chronic)    Weight loss encouraged; check labs in the next few weeks or months (patient will wait b/c of coronavirus outbreak); he'll call and make that appointment      Relevant Medications   metFORMIN (GLUCOPHAGE XR) 500 MG 24 hr tablet     Other   Vitamin D deficiency    He finished the supplements; will check vit D with next  labs  Tobacco abuse    Encourage complete cessation      Elevated blood pressure reading    Check that at some point          Follow up plan: Return in about 6 months (around 10/05/2018) for follow-up visit with Dr. Sherie Don.  An after-visit summary was printed and given to the patient at check-out.  Please see the patient instructions which may contain other information and recommendations beyond what is mentioned above in the assessment and plan.  Meds ordered this encounter  Medications  . metFORMIN (GLUCOPHAGE XR) 500 MG 24 hr tablet    Sig: One by mouth daily for one week, then two daily    Dispense:  83 tablet    Refill:  0    83 pills = 3 month supply    No orders of the defined types were placed in this encounter.

## 2018-04-05 NOTE — Assessment & Plan Note (Signed)
Weight loss encouraged; check labs in the next few weeks or months (patient will wait b/c of coronavirus outbreak); he'll call and make that appointment

## 2018-04-05 NOTE — Assessment & Plan Note (Signed)
He finished the supplements; will check vit D with next labs

## 2018-04-05 NOTE — Assessment & Plan Note (Signed)
Continue to wear CPAP, managed by pulm

## 2018-04-05 NOTE — Assessment & Plan Note (Signed)
Encourage complete cessation.  °

## 2018-04-05 NOTE — Assessment & Plan Note (Signed)
Check that at some point

## 2018-04-24 DIAGNOSIS — G4733 Obstructive sleep apnea (adult) (pediatric): Secondary | ICD-10-CM | POA: Diagnosis not present

## 2018-05-24 DIAGNOSIS — G4733 Obstructive sleep apnea (adult) (pediatric): Secondary | ICD-10-CM | POA: Diagnosis not present

## 2018-06-24 DIAGNOSIS — G4733 Obstructive sleep apnea (adult) (pediatric): Secondary | ICD-10-CM | POA: Diagnosis not present

## 2018-07-24 DIAGNOSIS — G4733 Obstructive sleep apnea (adult) (pediatric): Secondary | ICD-10-CM | POA: Diagnosis not present

## 2018-07-30 ENCOUNTER — Ambulatory Visit: Payer: BLUE CROSS/BLUE SHIELD | Admitting: Nurse Practitioner

## 2018-08-13 ENCOUNTER — Ambulatory Visit: Payer: BC Managed Care – PPO | Admitting: Nurse Practitioner

## 2018-08-14 ENCOUNTER — Encounter: Payer: Self-pay | Admitting: Nurse Practitioner

## 2018-08-14 ENCOUNTER — Other Ambulatory Visit: Payer: Self-pay

## 2018-08-14 ENCOUNTER — Ambulatory Visit: Payer: BC Managed Care – PPO | Admitting: Nurse Practitioner

## 2018-08-14 VITALS — BP 138/64 | HR 97 | Temp 97.5°F | Resp 14 | Ht 64.0 in | Wt 252.7 lb

## 2018-08-14 DIAGNOSIS — E559 Vitamin D deficiency, unspecified: Secondary | ICD-10-CM

## 2018-08-14 DIAGNOSIS — G4733 Obstructive sleep apnea (adult) (pediatric): Secondary | ICD-10-CM

## 2018-08-14 DIAGNOSIS — B070 Plantar wart: Secondary | ICD-10-CM

## 2018-08-14 DIAGNOSIS — Z720 Tobacco use: Secondary | ICD-10-CM

## 2018-08-14 DIAGNOSIS — E119 Type 2 diabetes mellitus without complications: Secondary | ICD-10-CM

## 2018-08-14 DIAGNOSIS — B351 Tinea unguium: Secondary | ICD-10-CM

## 2018-08-14 NOTE — Patient Instructions (Addendum)
-  Please come in within one week to get fasting labs completed.  - please schedule routine annual eye exam - Recommend 1,000 IU of vitamin D3 daily and increase green leafy vegetables in diet to provide plenty of vitamin K   Coping with Quitting Smoking Quitting smoking is a physical and mental challenge. You will face cravings, withdrawal symptoms, and temptation. Before quitting, work with your health care provider to make a plan that can help you cope. Preparation can help you quit and keep you from giving in. How can I cope with cravings? Cravings usually last for 5-10 minutes. If you get through it, the craving will pass. Consider taking the following actions to help you cope with cravings:  Keep your mouth busy: ? Chew sugar-free gum. ? Suck on hard candies or a straw. ? Brush your teeth.  Keep your hands and body busy: ? Immediately change to a different activity when you feel a craving. ? Squeeze or play with a ball. ? Do an activity or a hobby, like making bead jewelry, practicing needlepoint, or working with wood. ? Mix up your normal routine. ? Take a short exercise break. Go for a quick walk or run up and down stairs. ? Spend time in public places where smoking is not allowed.  Focus on doing something kind or helpful for someone else.  Call a friend or family member to talk during a craving.  Join a support group.  Call a quit line, such as 1-800-QUIT-NOW.  Talk with your health care provider about medicines that might help you cope with cravings and make quitting easier for you. How can I deal with withdrawal symptoms? Your body may experience negative effects as it tries to get used to not having nicotine in the system. These effects are called withdrawal symptoms. They may include:  Feeling hungrier than normal.  Trouble concentrating.  Irritability.  Trouble sleeping.  Feeling depressed.  Restlessness and agitation.  Craving a cigarette. To manage  withdrawal symptoms:  Avoid places, people, and activities that trigger your cravings.  Remember why you want to quit.  Get plenty of sleep.  Avoid coffee and other caffeinated drinks. These may worsen some of your symptoms. How can I handle social situations? Social situations can be difficult when you are quitting smoking, especially in the first few weeks. To manage this, you can:  Avoid parties, bars, and other social situations where people might be smoking.  Avoid alcohol.  Leave right away if you have the urge to smoke.  Explain to your family and friends that you are quitting smoking. Ask for understanding and support.  Plan activities with friends or family where smoking is not an option. What are some ways I can cope with stress? Wanting to smoke may cause stress, and stress can make you want to smoke. Find ways to manage your stress. Relaxation techniques can help. For example:  Breathe slowly and deeply, in through your nose and out through your mouth.  Listen to soothing, relaxing music.  Talk with a family member or friend about your stress.  Light a candle.  Soak in a bath or take a shower.  Think about a peaceful place. What are some ways I can prevent weight gain? Be aware that many people gain weight after they quit smoking. However, not everyone does. To keep from gaining weight, have a plan in place before you quit and stick to the plan after you quit. Your plan should include:  Having  healthy snacks. When you have a craving, it may help to: ? Eat plain popcorn, crunchy carrots, celery, or other cut vegetables. ? Chew sugar-free gum.  Changing how you eat: ? Eat small portion sizes at meals. ? Eat 4-6 small meals throughout the day instead of 1-2 large meals a day. ? Be mindful when you eat. Do not watch television or do other things that might distract you as you eat.  Exercising regularly: ? Make time to exercise each day. If you do not have time  for a [place] workout, do short bouts of exercise for 5-10 minutes several times a day. ? Do some form of strengthening exercise, like weight lifting, and some form of aerobic exercise, like running or swimming.  Drinking plenty of water or other low-calorie or no-calorie drinks. Drink 6-8 glasses of water daily, or as much as instructed by your health care provider. Summary  Quitting smoking is a physical and mental challenge. You will face cravings, withdrawal symptoms, and temptation to smoke again. Preparation can help you as you go through these challenges.  You can cope with cravings by keeping your mouth busy (such as by chewing gum), keeping your body and hands busy, and making calls to family, friends, or a helpline for people who want to quit smoking.  You can cope with withdrawal symptoms by avoiding places where people smoke, avoiding drinks with caffeine, and getting plenty of rest.  Ask your health care provider about the different ways to prevent weight gain, avoid stress, and handle social situations. This information is not intended to replace advice given to you by your health care provider. Make sure you discuss any questions you have with your health care provider. Document Released: 12/17/2015 Document Revised: 12/01/2016 Document Reviewed: 12/17/2015 Elsevier Patient Education  2020 [employer].

## 2018-08-14 NOTE — Progress Notes (Signed)
Name: Alvin Green   MRN: [id number]    DOB: 08-31-1970   Date:08/14/2018       Progress Note  Subjective  Chief Complaint  Chief Complaint  Patient presents with  . Follow-up    HPI  Diabetes Mellitus Patient is rx metformin 500 mg BID. States he was taking it like that up to a month and a half ago when he ran out and never picked up new script.   His A1C 2 years ago was 12.7 he cut out sodas and iced coffees then and within a year it went down to 5.2%. Last year his A1C increased to 6.3% Denies polyphagia, polydipsia, polyuria.  Denies neuropathy.  Last eye exam was 03/2017- needs to reschedule.  He is not on statin or ACEi/ARB. Lab Results  Component Value Date   HGBA1C 6.3 (H) 08/31/2017  Diet: wife cooks more now, not eating out as much, trying to stay away from fried foods. Eats vegetables multiple times a day.  Wt Readings from Last 3 Encounters:  03/04/18 255 lb 6.4 oz (115.8 kg)  01/29/18 254 lb (115.2 kg)  08/02/17 259 lb (117.5 kg)    Obstructive sleep apnea Self reported CPAP compliance 90%  Smoking history: smokes 2-3 packs a week; has cut down but not willing to quit.   Vitamin D deficiency Takes vitamin D supplementation   PHQ2/9: Depression screen [facility] 2/9 08/14/2018 04/05/2018 03/04/2018 01/29/2018 07/20/2017  Decreased Interest 0 0 0 0 0  Down, Depressed, Hopeless 0 0 0 0 0  PHQ - 2 Score 0 0 0 0 0  Altered sleeping 0 0 0 0 -  Tired, decreased energy 0 0 0 0 -  Change in appetite 0 0 0 0 -  Feeling bad or failure about yourself  0 0 0 0 -  Trouble concentrating 0 0 0 0 -  Moving slowly or fidgety/restless 0 0 0 0 -  Suicidal thoughts 0 0 0 0 -  PHQ-9 Score 0 0 0 0 -  Difficult doing work/chores Not difficult at all Not difficult at all Not difficult at all Not difficult at all -     PHQ reviewed. Negative  Patient Active Problem List   Diagnosis Date Noted  . Tobacco abuse 04/05/2018  . Acquired trigger finger 08/02/2017  . Sprain of  metacarpophalangeal joint 08/02/2017  . Obstructive sleep apnea 07/20/2017  . Vitamin D deficiency 03/15/2017  . Type 2 diabetes mellitus ([place]) 04/28/2016  . Morbid obesity ([place]) 09/28/2014    Past Medical History:  Diagnosis Date  . Decreased libido   . Heart murmur   . Obesity   . Sleep apnea in adult   . Type 2 diabetes mellitus ([place]) 04/28/2016    Past Surgical History:  Procedure Laterality Date  . CARPAL TUNNEL RELEASE    . CARPAL TUNNEL RELEASE Right    approximately 10 years ago    Social History   Tobacco Use  . Smoking status: Current Every Day Smoker    Packs/day: 0.50    Years: 9.00    Pack years: 4.50    Types: Cigarettes  . Smokeless tobacco: Never Used  Substance Use Topics  . Alcohol use: Yes    Alcohol/week: 0.0 standard drinks    Comment: occasionally     Current Outpatient Medications:  .  aspirin EC 81 MG tablet, Take 1 tablet (81 mg total) by mouth daily., Disp: 90 tablet, Rfl: 0 .  metFORMIN (GLUCOPHAGE XR) 500 MG 24 hr  tablet, One by mouth daily for one week, then two daily (Patient not taking: Reported on 08/14/2018), Disp: 83 tablet, Rfl: 0  No Known Allergies  ROS    No other specific complaints in a complete review of systems (except as listed in HPI above).  Objective  Vitals:   08/14/18 1540  BP: 138/64  Pulse: 97  Resp: 14  Temp: (!) 97.5 F (36.4 C)  SpO2: 98%  Height: 5\' 4"  (1.626 m)     Body mass index is 43.84 kg/m.  Nursing Note and Vital Signs reviewed.  Physical Exam  Constitutional: Patient appears well-developed and well-nourished. Obese  No distress.  HEENT: head atraumatic, normocephalic, pupils equal  Cardiovascular: Normal rate, regular rhythm and normal heart sounds.  No murmur heard. No BLE edema. Pulmonary/Chest: Effort normal and breath sounds normal. No respiratory distress. Abdominal: Soft.  There is no tenderness. Psychiatric: Patient has a normal mood and affect. behavior is normal. Judgment  and thought content normal.   Diabetic Foot Exam - Simple   Simple Foot Form Diabetic Foot exam was performed with the following findings: Yes 08/14/2018  3:40 PM  Visual Inspection Sensation Testing Intact to touch and monofilament testing bilaterally: Yes Pulse Check Posterior Tibialis and Dorsalis pulse intact bilaterally: Yes Comments Patient has two plantar warts to right foot, and thick [place] toe nails on both feet.      No results found for this or any previous visit (from the past 48 hour(s)).  Assessment & Plan  1. Type 2 diabetes mellitus without complication, without [place]-term current use of insulin (HCC) Discussed statin and ARB addition if a1c is increased, recommend eye exam  - HgB A1c - Urine Microalbumin w/creat. ratio - COMPLETE METABOLIC PANEL WITH GFR - Lipid Profile - Ambulatory referral to Podiatry  2. Obstructive sleep apnea Continue cpap  3. Vitamin D deficiency Recommend supplementation   4. Morbid obesity (HCC) Recommend diet and exercise - Lipid Profile  5. Tobacco abuse Counseling to quit   6. Plantar verruca - Ambulatory referral to Podiatry  7. Onychomycosis - Ambulatory referral to Podiatry

## 2018-08-22 DIAGNOSIS — E119 Type 2 diabetes mellitus without complications: Secondary | ICD-10-CM | POA: Diagnosis not present

## 2018-08-23 LAB — COMPLETE METABOLIC PANEL WITH GFR
AG Ratio: 1.6 (calc) (ref 1.0–2.5)
ALT: 24 U/L (ref 9–46)
AST: 21 U/L (ref 10–40)
Albumin: 4 g/dL (ref 3.6–5.1)
Alkaline phosphatase (APISO): 65 U/L (ref 36–130)
BUN: 13 mg/dL (ref 7–25)
CO2: 25 mmol/L (ref 20–32)
Calcium: 9.3 mg/dL (ref 8.6–10.3)
Chloride: 107 mmol/L (ref 98–110)
Creat: 0.87 mg/dL (ref 0.60–1.35)
GFR, Est African American: 118 mL/min/{1.73_m2} (ref >=60)
GFR, Est Non African American: 102 mL/min/{1.73_m2} (ref >=60)
Globulin: 2.5 g/dL (calc) (ref 1.9–3.7)
Glucose, Bld: 122 mg/dL — ABNORMAL HIGH (ref 65–99)
Potassium: 4.7 mmol/L (ref 3.5–5.3)
Sodium: 140 mmol/L (ref 135–146)
Total Bilirubin: 0.5 mg/dL (ref 0.2–1.2)
Total Protein: 6.5 g/dL (ref 6.1–8.1)

## 2018-08-23 LAB — LIPID PANEL
Cholesterol: 147 mg/dL (ref <200)
HDL: 53 mg/dL (ref >=40)
LDL Cholesterol (Calc): 80 mg/dL (calc)
Non-HDL Cholesterol (Calc): 94 mg/dL (calc) (ref <130)
Total CHOL/HDL Ratio: 2.8 (calc) (ref <5.0)
Triglycerides: 61 mg/dL (ref <150)

## 2018-08-23 LAB — HEMOGLOBIN A1C
Hgb A1c MFr Bld: 6.3 % of total Hgb — ABNORMAL HIGH (ref <5.7)
Mean Plasma Glucose: 134 (calc)
eAG (mmol/L): 7.4 (calc)

## 2018-08-23 LAB — MICROALBUMIN / CREATININE URINE RATIO
Creatinine, Urine: 170 mg/dL (ref 20–320)
Microalb Creat Ratio: 9 mcg/mg creat (ref <30)
Microalb, Ur: 1.6 mg/dL

## 2018-09-03 IMAGING — CR DG KNEE COMPLETE 4+V*R*
4 series · 4 of 4 positions shown · non-contrast
Comparison: 10/14/2008

CLINICAL DATA: RIGHT knee pain

EXAM:
RIGHT KNEE - COMPLETE 4+ VIEW

[knee ap]
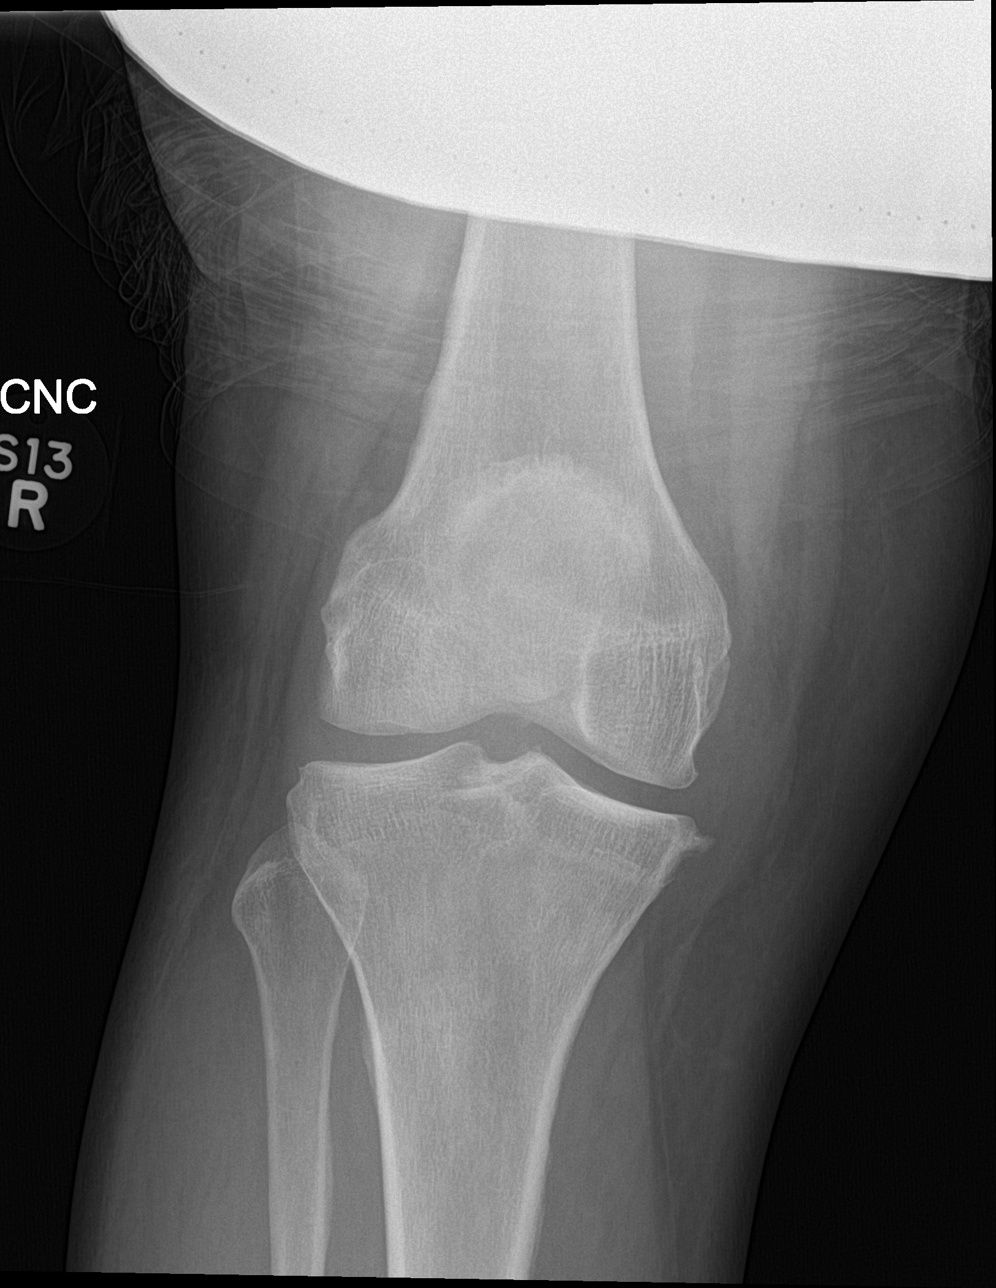

[knee obl (1 of 2)]
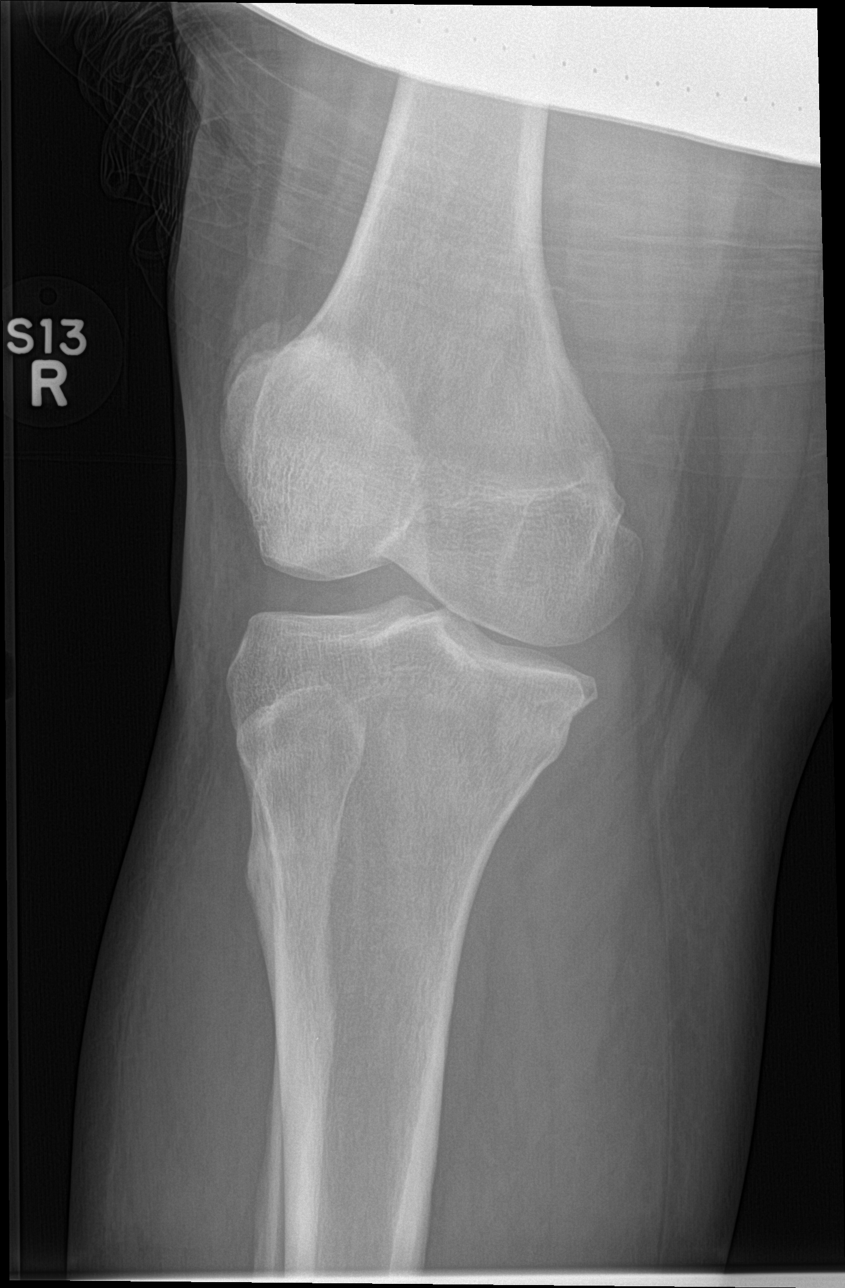

[knee obl (2 of 2)]
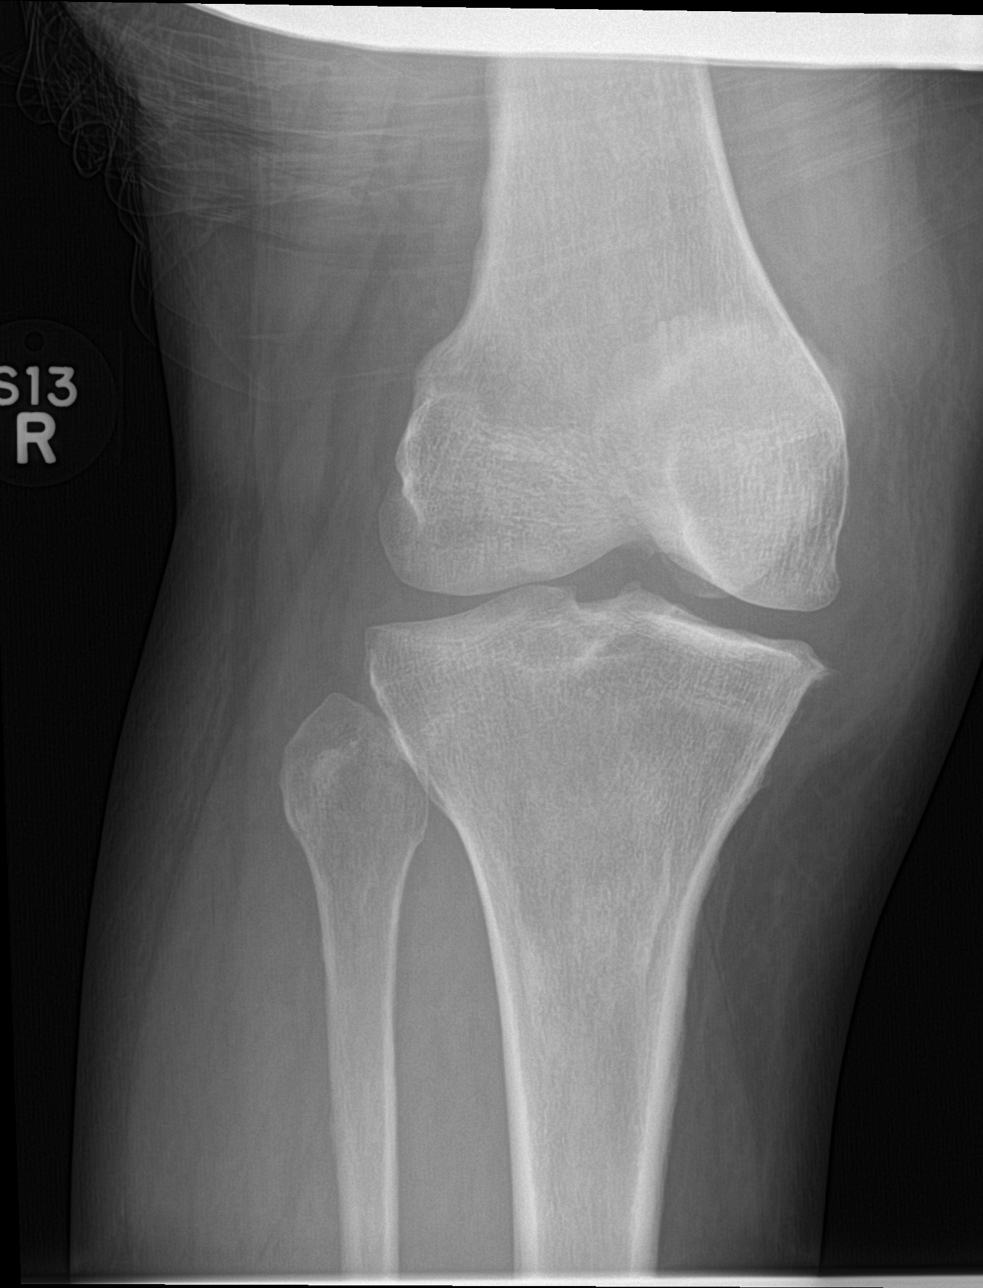

[knee lat]
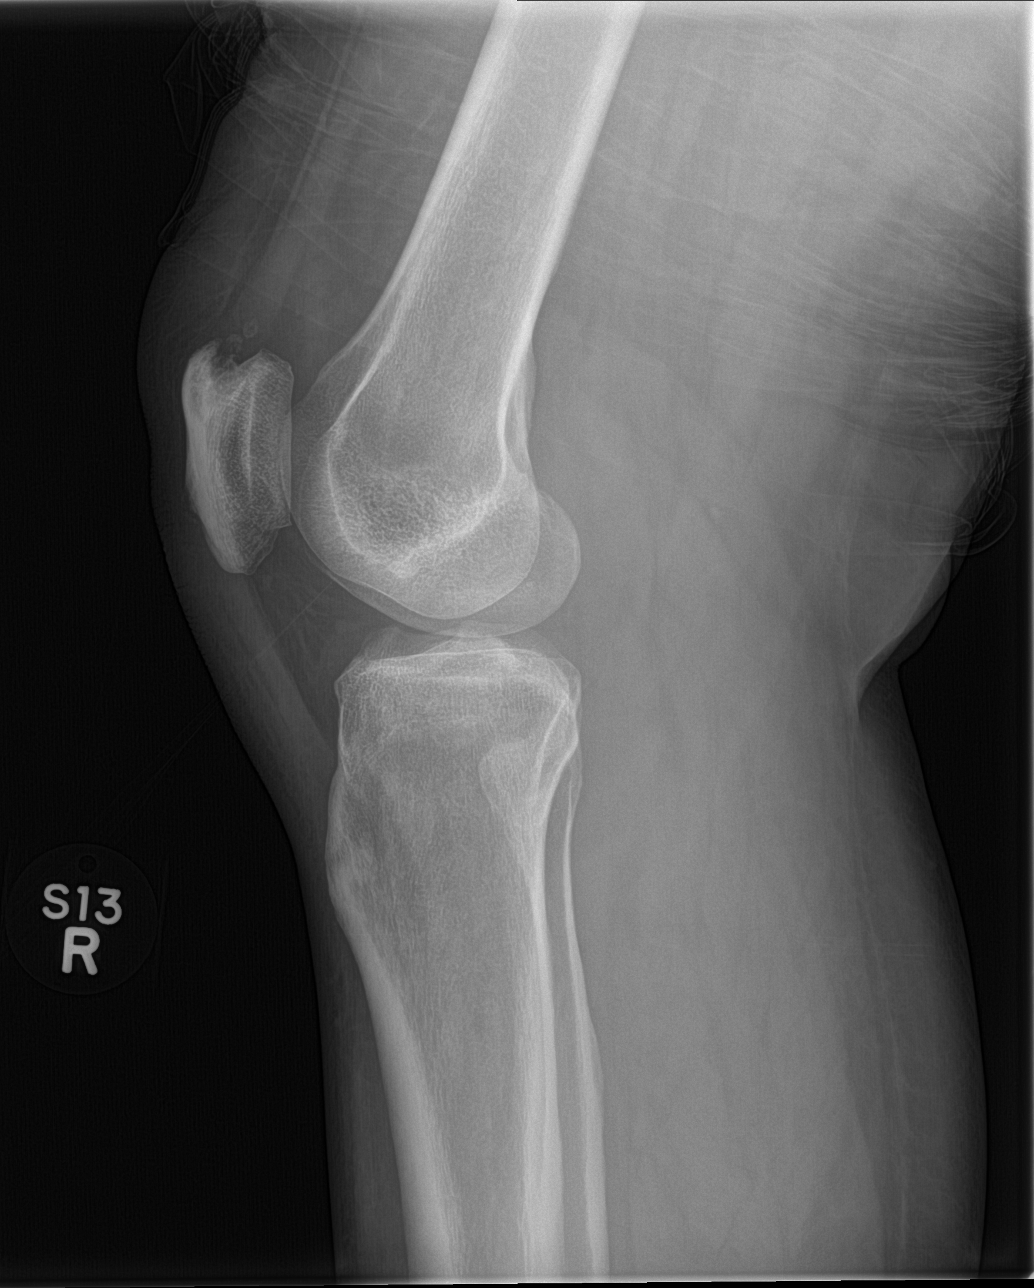

[4 of 4 positions shown; findings below may reference images not displayed]

FINDINGS: There is spurring of the superior aspect of the patella. Mild
spurring of the lateral compartment. Joint spaces are well
maintained. No effusion. No evidence of fracture.
IMPRESSION: 1. No acute findings of the knee.
2. Mild degenerate spurring.

## 2018-10-04 ENCOUNTER — Ambulatory Visit: Payer: BLUE CROSS/BLUE SHIELD | Admitting: Podiatry

## 2018-10-15 ENCOUNTER — Ambulatory Visit: Payer: Self-pay | Admitting: Podiatry

## 2018-10-15 ENCOUNTER — Other Ambulatory Visit: Payer: Self-pay

## 2018-10-15 DIAGNOSIS — L989 Disorder of the skin and subcutaneous tissue, unspecified: Secondary | ICD-10-CM | POA: Diagnosis not present

## 2018-10-15 DIAGNOSIS — M659 Synovitis and tenosynovitis, unspecified: Secondary | ICD-10-CM

## 2018-10-15 DIAGNOSIS — B351 Tinea unguium: Secondary | ICD-10-CM

## 2018-10-15 MED ORDER — TERBINAFINE HCL 250 MG PO TABS: 250.0000 mg | ORAL_TABLET | Freq: Every day | ORAL | 0 refills | Status: DC

## 2018-10-20 NOTE — Progress Notes (Signed)
   Subjective: 48 y.o. male with PMHx of T2DM presenting to the office today for follow up evaluation of painful callus lesions noted to the bilateral feet and left ankle synovitis. He states he is doing well. He reports some continued pain from the ankle and states the calluses are becoming painful again. Walking and bearing weight increases the symptoms. He has been using the ankle brace as directed.  He is also concerned for fungus of nails 1-5 bilaterally. He states he has taken Lamisil in the past but has not had any recent treatment. There are no modifying factors noted. Patient is here for further evaluation and treatment.    Past Medical History:  Diagnosis Date  . Decreased libido   . Heart murmur   . Obesity   . Sleep apnea in adult   . Type 2 diabetes mellitus ([place]) 04/28/2016     Objective:  Physical Exam General: Alert and oriented x3 in no acute distress  Dermatology: Hyperkeratotic lesion(s) present on the bilateral feet. Pain on palpation with a central nucleated core noted. Hyperkeratotic, discolored, thickened, onychodystrophy of nails 1-5 bilaterally. Skin is warm, dry and supple bilateral lower extremities. Negative for open lesions or macerations.  Vascular: Palpable pedal pulses bilaterally. No edema or erythema noted. Capillary refill within normal limits.  Neurological: Epicritic and protective threshold grossly intact bilaterally.   Musculoskeletal Exam: Pain on palpation at the keratotic lesion(s) noted and to the medial, anterior and lateral borders of the left ankle. Range of motion within normal limits bilateral. Muscle strength 5/5 in all groups bilateral.  Assessment: 1. Onychomycosis nails 1-5 bilateral  2. Ankle synovitis left 3. Porokeratosis bilateral x 5    Plan of Care:  1. Patient evaluated 2. Excisional debridement of keratoic lesion(s) using a chisel blade was performed without incident.  3. Dressed area with light dressing. 4.  Prescription for Lamisil 250 mg #90 provided to patient.  5. Injection of 0.5 mLs Celestone Soluspan injected into the left ankle joint.  6. Continue using ankle brace.  7. Recommended OTC Urea 40%.  8. Patient is to return to the clinic PRN.   Edrick Kins, DPM [facility]  Dr. Edrick Kins, [address]                                        [place], [place] [address]                Office [phone]  Fax [phone]

## 2019-01-24 DIAGNOSIS — G4733 Obstructive sleep apnea (adult) (pediatric): Secondary | ICD-10-CM | POA: Diagnosis not present

## 2019-02-18 ENCOUNTER — Ambulatory Visit: Payer: BC Managed Care – PPO | Admitting: Family Medicine

## 2019-02-18 ENCOUNTER — Encounter: Payer: Self-pay | Admitting: Family Medicine

## 2019-02-18 ENCOUNTER — Other Ambulatory Visit: Payer: Self-pay

## 2019-02-18 VITALS — BP 140/86 | HR 93 | Temp 98.0°F | Resp 14 | Ht 66.0 in | Wt 254.2 lb

## 2019-02-18 DIAGNOSIS — R030 Elevated blood-pressure reading, without diagnosis of hypertension: Secondary | ICD-10-CM | POA: Diagnosis not present

## 2019-02-18 DIAGNOSIS — E785 Hyperlipidemia, unspecified: Secondary | ICD-10-CM

## 2019-02-18 DIAGNOSIS — E559 Vitamin D deficiency, unspecified: Secondary | ICD-10-CM

## 2019-02-18 DIAGNOSIS — Z5181 Encounter for therapeutic drug level monitoring: Secondary | ICD-10-CM | POA: Diagnosis not present

## 2019-02-18 DIAGNOSIS — E119 Type 2 diabetes mellitus without complications: Secondary | ICD-10-CM | POA: Diagnosis not present

## 2019-02-18 DIAGNOSIS — Z720 Tobacco use: Secondary | ICD-10-CM

## 2019-02-18 DIAGNOSIS — Z6841 Body Mass Index (BMI) 40.0 and over, adult: Secondary | ICD-10-CM

## 2019-02-18 LAB — POCT GLYCOSYLATED HEMOGLOBIN (HGB A1C): Hemoglobin A1C: 8.2 % — AB (ref 4.0–5.6)

## 2019-02-18 MED ORDER — METFORMIN HCL ER 500 MG PO TB24: 500.0000 mg | ORAL_TABLET | Freq: Two times a day (BID) | ORAL | 2 refills | Status: DC

## 2019-02-18 NOTE — Progress Notes (Signed)
Name: Alvin Green   MRN: [id number]    DOB: [birth year]   Date:02/18/2019       Progress Note  Chief Complaint  Patient presents with  . Follow-up    6 months  . Diabetes     Subjective:   Alvin Green is a 49 y.o. male, presents to clinic for routine follow up on the conditions listed above.  Diabetes Mellitus Type II: Currently managing with metformin, but ran out last year and never restarted this medication since last office visit his blood sugar was still controlled Fasting CBGs typically run around 115, high of around 239 around holidays, he states this was in isolated high CBG, no lows Denies: Polyuria, polydipsia, polyphagia, vision changes, or neuropathy he further states that he knows when his blood sugar is high because he will start urinating a lot  Recent pertinent labs: Lab Results  Component Value Date   HGBA1C 6.3 (H) 08/22/2018   HGBA1C 6.3 (H) 08/31/2017   HGBA1C 5.3 12/14/2016      Component Value Date/Time   NA 140 08/22/2018 1028   K 4.7 08/22/2018 1028   CL 107 08/22/2018 1028   CO2 25 08/22/2018 1028   GLUCOSE 122 (H) 08/22/2018 1028   BUN 13 08/22/2018 1028   CREATININE 0.87 08/22/2018 1028   CALCIUM 9.3 08/22/2018 1028   PROT 6.5 08/22/2018 1028   ALBUMIN 4.2 04/28/2016 1231   AST 21 08/22/2018 1028   ALT 24 08/22/2018 1028   ALKPHOS 91 04/28/2016 1231   BILITOT 0.5 08/22/2018 1028   GFRNONAA 102 08/22/2018 1028   GFRAA 118 08/22/2018 1028   UTD on DM foot exam and due for eye exam ACEI/ARB: No Statin: No  Weight not changed  He had crestor 5 mg in 2018 but doesn't remember taking it Has not been on BP meds in the past Last visit he was off metformin and A1C was controlled  Today BP is elevated above goal of 130/80 BP Readings from Last 3 Encounters:  02/18/19 140/86  08/14/18 138/64  03/04/18 (!) 142/80  He denies any CP, SOB, LE edema, HA,   The 10-year ASCVD risk score Mikey Bussing DC Jr., et al., 2013) is:  15.8%   Values used to calculate the score:     Age: 75 years     Sex: Male     Is Non-Hispanic African American: Yes     Diabetic: Yes     Tobacco smoker: Yes     Systolic Blood Pressure: 315 mmHg     Is BP treated: No     HDL Cholesterol: 53 mg/dL     Total Cholesterol: 147 mg/dL Reviewed with him at length his risk factors including smoking, elevated blood pressure, diabetes and his last cholesterol labs.  Patient states that he is a current smoker but does not have an addictive personality and could quit if he wanted to discuss smoking cessation and available options, encouraged to quit if able. Smoking cessation instruction/counseling given:  counseled patient on the dangers of tobacco use, advised patient to stop smoking, and reviewed strategies to maximize success      Patient Active Problem List   Diagnosis Date Noted  . Tobacco abuse 04/05/2018  . Acquired trigger finger 08/02/2017  . Sprain of metacarpophalangeal joint 08/02/2017  . Obstructive sleep apnea 07/20/2017  . Vitamin D deficiency 03/15/2017  . Type 2 diabetes mellitus ([place]) 04/28/2016  . Class 3 severe obesity without serious comorbidity with body mass index (  BMI) of 40.0 to 44.9 in adult [facility]) 09/28/2014    Past Surgical History:  Procedure Laterality Date  . CARPAL TUNNEL RELEASE    . CARPAL TUNNEL RELEASE Right    approximately 10 years ago    Family History  Problem Relation Age of Onset  . Hypertension Mother   . Sleep disorder Father   . Hypertension Brother     Social History   Tobacco Use  . Smoking status: Current Every Day Smoker    Packs/day: 0.50    Years: 20.00    Pack years: 10.00    Types: Cigarettes  . Smokeless tobacco: Never Used  Substance Use Topics  . Alcohol use: Yes    Alcohol/week: 0.0 standard drinks    Comment: occasionally  . Drug use: No      Current Outpatient Medications:  .  aspirin EC 81 MG tablet, Take 1 tablet (81 mg total) by mouth daily., Disp: 90  tablet, Rfl: 0 .  terbinafine (LAMISIL) 250 MG tablet, Take 1 tablet (250 mg total) by mouth daily., Disp: 90 tablet, Rfl: 0 .  metFORMIN (GLUCOPHAGE XR) 500 MG 24 hr tablet, Take 1 tablet (500 mg total) by mouth 2 (two) times daily with a meal., Disp: 180 tablet, Rfl: 2  No Known Allergies  Chart Review Today: I personally reviewed active problem list, medication list, allergies, family history, social history, health maintenance, notes from last encounter, lab results, imaging with the patient/caregiver today.  Review of Systems  Constitutional: Negative.   HENT: Negative.   Eyes: Negative.   Respiratory: Negative.   Cardiovascular: Negative.   Gastrointestinal: Negative.   Endocrine: Negative.   Genitourinary: Negative.   Musculoskeletal: Negative.   Skin: Negative.   Allergic/Immunologic: Negative.   Neurological: Negative.   Hematological: Negative.   Psychiatric/Behavioral: Negative.   All other systems reviewed and are negative.    Objective:    Vitals:   02/18/19 0915  BP: 140/86  Pulse: 93  Resp: 14  Temp: 98 F (36.7 C)  SpO2: 97%  Weight: 254 lb 3.2 oz (115.3 kg)  Height: 5\' 6"  (1.676 m)    Body mass index is 41.03 kg/m.  Physical Exam Vitals and nursing note reviewed.  Constitutional:      General: He is not in acute distress.    Appearance: Normal appearance. He is well-developed. He is obese. He is not ill-appearing, toxic-appearing or diaphoretic.     Interventions: Face mask in place.  HENT:     Head: Normocephalic and atraumatic.     Jaw: No trismus.     Right Ear: External ear normal.     Left Ear: External ear normal.     Nose: Nose normal.  Eyes:     General: Lids are normal. No scleral icterus.       Right eye: No discharge.        Left eye: No discharge.     Conjunctiva/sclera: Conjunctivae normal.     Pupils: Pupils are equal, round, and reactive to light.  Neck:     Trachea: Trachea and phonation normal. No tracheal deviation.    Cardiovascular:     Rate and Rhythm: Normal rate and regular rhythm.     Pulses: Normal pulses.          Radial pulses are 2+ on the right side and 2+ on the left side.       Posterior tibial pulses are 2+ on the right side and 2+ on the left side.  Heart sounds: Normal heart sounds. No murmur. No friction rub. No gallop.   Pulmonary:     Effort: Pulmonary effort is normal. No respiratory distress.     Breath sounds: Normal breath sounds. No stridor. No wheezing, rhonchi or rales.  Abdominal:     General: Bowel sounds are normal. There is no distension.     Palpations: Abdomen is soft.     Tenderness: There is no abdominal tenderness. There is no guarding or rebound.  Musculoskeletal:        General: Normal range of motion.     Cervical back: Normal range of motion and neck supple.     Right lower leg: No edema.     Left lower leg: No edema.  Skin:    General: Skin is warm and dry.     Capillary Refill: Capillary refill takes less than 2 seconds.     Coloration: Skin is not jaundiced or pale.     Findings: No rash.     Nails: There is no clubbing.  Neurological:     Mental Status: He is alert.     Cranial Nerves: No dysarthria or facial asymmetry.     Motor: No tremor or abnormal muscle tone.     Gait: Gait normal.  Psychiatric:        Attention and Perception: Attention normal.        Mood and Affect: Mood normal.        Speech: Speech normal.        Behavior: Behavior normal. Behavior is cooperative.       PHQ2/9: Depression screen [facility] 2/9 02/18/2019 08/14/2018 04/05/2018 03/04/2018 01/29/2018  Decreased Interest 0 0 0 0 0  Down, Depressed, Hopeless 0 0 0 0 0  PHQ - 2 Score 0 0 0 0 0  Altered sleeping 0 0 0 0 0  Tired, decreased energy 0 0 0 0 0  Change in appetite 0 0 0 0 0  Feeling bad or failure about yourself  0 0 0 0 0  Trouble concentrating 0 0 0 0 0  Moving slowly or fidgety/restless 0 0 0 0 0  Suicidal thoughts 0 0 0 0 0  PHQ-9 Score 0 0 0 0 0  Difficult  doing work/chores Not difficult at all Not difficult at all Not difficult at all Not difficult at all Not difficult at all    phq 9 is neg, reviewed today  Fall Risk: Fall Risk  02/18/2019 08/14/2018 04/05/2018 03/04/2018 01/29/2018  Falls in the past year? 0 0 0 0 0  Number falls in past yr: 0 0 - 0 0  Injury with Fall? 0 0 - 0 0    Functional Status Survey: Is the patient deaf or have difficulty hearing?: No Does the patient have difficulty seeing, even when wearing glasses/contacts?: No Does the patient have difficulty concentrating, remembering, or making decisions?: No Does the patient have difficulty walking or climbing stairs?: No Does the patient have difficulty dressing or bathing?: No Does the patient have difficulty doing errands alone such as visiting a doctor's office or shopping?: No   Assessment & Plan:   1. Type 2 diabetes mellitus without complication, without [place]-term current use of insulin (HCC) .  Uncontrolled, worsening POCT HgB A1C  Result Value Ref Range   Hemoglobin A1C 8.2 (A) 4.0 - 5.6 %  Increase of A1c to 8.2 now uncontrolled and will require medications again encourage patient to restart Metformin at 500 mg once daily then after  1 to 2 weeks increase to 500 mg twice daily and gradually increase until 1000 mg twice daily as tolerated with GI side effects, encouraged him to work more diligently on diet and exercise  - POCT HgB A1C - COMPLETE METABOLIC PANEL WITH GFR - Lipid panel - CBC with Differential/Platelet - metFORMIN (GLUCOPHAGE XR) 500 MG 24 hr tablet; Take 1 tablet (500 mg total) by mouth 2 (two) times daily with a meal.  Dispense: 180 tablet; Refill: 2 - Hemoglobin A1c  2. Elevated blood pressure reading Will likely need to ACE or an ARB to improve blood pressure readings and with diabetes diagnoses - COMPLETE METABOLIC PANEL WITH GFR  3. Hyperlipidemia, unspecified hyperlipidemia type Reviewed his cholesterol and ASCVD risk score today is very  high we will recalculate with his lab results but did discuss the benefits of starting a statin medication, discussed his modifiable risk factors including diabetes, improving blood pressure, smoking cessation The 10-year ASCVD risk score Denman George DC Jr., et al., 2013) is: 15.8%   Values used to calculate the score:     Age: 51 years     Sex: Male     Is Non-Hispanic African American: Yes     Diabetic: Yes     Tobacco smoker: Yes     Systolic Blood Pressure: 140 mmHg     Is BP treated: No     HDL Cholesterol: 53 mg/dL     Total Cholesterol: 147 mg/dL  - COMPLETE METABOLIC PANEL WITH GFR - Lipid panel  4. Class 3 severe obesity without serious comorbidity with body mass index (BMI) of 40.0 to 44.9 in adult, unspecified obesity type (HCC) Would benefit from lifestyle changes and some weight loss  5. Vitamin D deficiency - VITAMIN D 25 Hydroxy (Vit-D Deficiency, Fractures)  6. Tobacco abuse See above smoking cessation, patient declined any referrals or medications but nodded his head and stated that he could quit if he wanted to suspect he may work on this to decrease his risk so he does not have to take his many medications.  7. Medication monitoring encounter - COMPLETE METABOLIC PANEL WITH GFR - Lipid panel - CBC with Differential/Platelet    Return in about 6 months (around 08/18/2019) for 6 month Annual Physical, 3 month DM f/up virtual ok - do A1C prior to visit.   Danelle Berry, PA-C 02/18/19 11:35 PM

## 2019-02-18 NOTE — Patient Instructions (Addendum)
Blood sugar goal - fasting blood sugars between 80 -130 would be at GOAL! If you are not having blood sugars in that range then restart metformin.  Come by after May 16th for labs only for repeat A1C  Lab Results  Component Value Date   HGBA1C 8.2 (A) 02/18/2019   Lab Results  Component Value Date   HGBA1C 6.3 (H) 08/22/2018   HGBA1C 6.3 (H) 08/31/2017   HGBA1C 5.3 12/14/2016    Goal for A1C is to have you below 7.0  Blood pressure goal is <130/80, if you Diabetes stays up then we will need to start you on a BP med and a cholesterol med to protect your kidneys, blood vessels, decrease your risk of heart attack, stroke, kidney damage.  Get your eye appointment   Diabetes Mellitus and Nutrition, Adult When you have diabetes (diabetes mellitus), it is very important to have healthy eating habits because your blood sugar (glucose) levels are greatly affected by what you eat and drink. Eating healthy foods in the appropriate amounts, at about the same times every day, can help you:  Control your blood glucose.  Lower your risk of heart disease.  Improve your blood pressure.  Reach or maintain a healthy weight. Every person with diabetes is different, and each person has different needs for a meal plan. Your health care provider may recommend that you work with a diet and nutrition specialist (dietitian) to make a meal plan that is best for you. Your meal plan may vary depending on factors such as:  The calories you need.  The medicines you take.  Your weight.  Your blood glucose, blood pressure, and cholesterol levels.  Your activity level.  Other health conditions you have, such as heart or kidney disease. How do carbohydrates affect me? Carbohydrates, also called carbs, affect your blood glucose level more than any other type of food. Eating carbs naturally raises the amount of glucose in your blood. Carb counting is a method for keeping track of how many carbs you eat.  Counting carbs is important to keep your blood glucose at a healthy level, especially if you use insulin or take certain oral diabetes medicines. It is important to know how many carbs you can safely have in each meal. This is different for every person. Your dietitian can help you calculate how many carbs you should have at each meal and for each snack. Foods that contain carbs include:  Bread, cereal, rice, pasta, and crackers.  Potatoes and corn.  Peas, beans, and lentils.  Milk and yogurt.  Fruit and juice.  Desserts, such as cakes, cookies, ice cream, and candy. How does alcohol affect me? Alcohol can cause a sudden decrease in blood glucose (hypoglycemia), especially if you use insulin or take certain oral diabetes medicines. Hypoglycemia can be a life-threatening condition. Symptoms of hypoglycemia (sleepiness, dizziness, and confusion) are similar to symptoms of having too much alcohol. If your health care provider says that alcohol is safe for you, follow these guidelines:  Limit alcohol intake to no more than 1 drink per day for nonpregnant women and 2 drinks per day for men. One drink equals 12 oz of beer, 5 oz of wine, or 1 oz of hard liquor.  Do not drink on an empty stomach.  Keep yourself hydrated with water, diet soda, or unsweetened iced tea.  Keep in mind that regular soda, juice, and other mixers may contain a lot of sugar and must be counted as carbs. What are  tips for following this plan?  Reading food labels  Start by checking the serving size on the "Nutrition Facts" label of packaged foods and drinks. The amount of calories, carbs, fats, and other nutrients listed on the label is based on one serving of the item. Many items contain more than one serving per package.  Check the total grams (g) of carbs in one serving. You can calculate the number of servings of carbs in one serving by dividing the total carbs by 15. For example, if a food has 30 g of total  carbs, it would be equal to 2 servings of carbs.  Check the number of grams (g) of saturated and trans fats in one serving. Choose foods that have low or no amount of these fats.  Check the number of milligrams (mg) of salt (sodium) in one serving. Most people should limit total sodium intake to less than 2,300 mg per day.  Always check the nutrition information of foods labeled as "low-fat" or "nonfat". These foods may be higher in added sugar or refined carbs and should be avoided.  Talk to your dietitian to identify your daily goals for nutrients listed on the label. Shopping  Avoid buying canned, premade, or processed foods. These foods tend to be high in fat, sodium, and added sugar.  Shop around the outside edge of the grocery store. This includes fresh fruits and vegetables, bulk grains, fresh meats, and fresh dairy. Cooking  Use low-heat cooking methods, such as baking, instead of high-heat cooking methods like deep frying.  Cook using healthy oils, such as olive, canola, or sunflower oil.  Avoid cooking with butter, cream, or high-fat meats. Meal planning  Eat meals and snacks regularly, preferably at the same times every day. Avoid going [place] periods of time without eating.  Eat foods high in fiber, such as fresh fruits, vegetables, beans, and whole grains. Talk to your dietitian about how many servings of carbs you can eat at each meal.  Eat 4-6 ounces (oz) of lean protein each day, such as lean meat, chicken, fish, eggs, or tofu. One oz of lean protein is equal to: ? 1 oz of meat, chicken, or fish. ? 1 egg. ?  cup of tofu.  Eat some foods each day that contain healthy fats, such as avocado, nuts, seeds, and fish. Lifestyle  Check your blood glucose regularly.  Exercise regularly as told by your health care provider. This may include: ? 150 minutes of moderate-intensity or vigorous-intensity exercise each week. This could be brisk walking, biking, or water  aerobics. ? Stretching and doing strength exercises, such as yoga or weightlifting, at least 2 times a week.  Take medicines as told by your health care provider.  Do not use any products that contain nicotine or tobacco, such as cigarettes and e-cigarettes. If you need help quitting, ask your health care provider.  Work with a Veterinary surgeon or diabetes educator to identify strategies to manage stress and any emotional and social challenges. Questions to ask a health care provider  Do I need to meet with a diabetes educator?  Do I need to meet with a dietitian?  What number can I call if I have questions?  When are the best times to check my blood glucose? Where to find more information:  American Diabetes Association: diabetes.org  Academy of Nutrition and Dietetics: www.eatright.[employer] of Diabetes and Digestive and Kidney Diseases (NIH): CarFlippers.tn Summary  A healthy meal plan will help you control your  blood glucose and maintain a healthy lifestyle.  Working with a diet and nutrition specialist (dietitian) can help you make a meal plan that is best for you.  Keep in mind that carbohydrates (carbs) and alcohol have immediate effects on your blood glucose levels. It is important to count carbs and to use alcohol carefully. This information is not intended to replace advice given to you by your health care provider. Make sure you discuss any questions you have with your health care provider. Document Revised: 12/01/2016 Document Reviewed: 01/24/2016 Elsevier Patient Education  [place].   Dyslipidemia Dyslipidemia is an imbalance of waxy, fat-like substances (lipids) in the blood. The body needs lipids in small amounts. Dyslipidemia often involves a high level of cholesterol or triglycerides, which are types of lipids. Common forms of dyslipidemia include:  High levels of LDL cholesterol. LDL is the type of cholesterol that causes fatty deposits  (plaques) to build up in the blood vessels that carry blood away from your heart (arteries).  Low levels of HDL cholesterol. HDL cholesterol is the type of cholesterol that protects against heart disease. High levels of HDL remove the LDL buildup from arteries.  High levels of triglycerides. Triglycerides are a fatty substance in the blood that is linked to a buildup of plaques in the arteries. What are the causes? Primary dyslipidemia is caused by changes (mutations) in genes that are passed down through families (inherited). These mutations cause several types of dyslipidemia. Secondary dyslipidemia is caused by lifestyle choices and diseases that lead to dyslipidemia, such as:  Eating a diet that is high in animal fat.  Not getting enough exercise.  Having diabetes, kidney disease, liver disease, or thyroid disease.  Drinking large amounts of alcohol.  Using certain medicines. What increases the risk? You are more likely to develop this condition if you are an older man or if you are a woman who has gone through menopause. Other risk factors include:  Having a family history of dyslipidemia.  Taking certain medicines, including birth control pills, steroids, some diuretics, and beta-blockers.  Smoking cigarettes.  Eating a high-fat diet.  Having certain medical conditions such as diabetes, polycystic ovary syndrome (PCOS), kidney disease, liver disease, or hypothyroidism.  Not exercising regularly.  Being overweight or obese with too much belly fat. What are the signs or symptoms? In most cases, dyslipidemia does not usually cause any symptoms. In severe cases, very high lipid levels can cause:  Fatty bumps under the skin (xanthomas).  White or gray ring around the black center (pupil) of the eye. Very high triglyceride levels can cause inflammation of the pancreas (pancreatitis). How is this diagnosed? Your health care provider may diagnose dyslipidemia based on a  routine blood test (fasting blood test). Because most people do not have symptoms of the condition, this blood testing (lipid profile) is done on adults age 51 and older and is repeated every 5 years. This test checks:  Total cholesterol. This measures the total amount of cholesterol in your blood, including LDL cholesterol, HDL cholesterol, and triglycerides. A healthy number is below 200.  LDL cholesterol. The target number for LDL cholesterol is different for each person, depending on individual risk factors. Ask your health care provider what your LDL cholesterol should be.  HDL cholesterol. An HDL level of 60 or higher is best because it helps to protect against heart disease. A number below 54 for men or below 88 for women increases the risk for heart disease.  Triglycerides. A  healthy triglyceride number is below 150. If your lipid profile is abnormal, your health care provider may do other blood tests. How is this treated? Treatment depends on the type of dyslipidemia that you have and your other risk factors for heart disease and stroke. Your health care provider will have a target range for your lipid levels based on this information. For many people, this condition may be treated by lifestyle changes, such as diet and exercise. Your health care provider may recommend that you:  Get regular exercise.  Make changes to your diet.  Quit smoking if you smoke. If diet changes and exercise do not help you reach your goals, your health care provider may also prescribe medicine to lower lipids. The most commonly prescribed type of medicine lowers your LDL cholesterol (statin drug). If you have a high triglyceride level, your provider may prescribe another type of drug (fibrate) or an omega-3 fish oil supplement, or both. Follow these instructions at home:  Eating and drinking  Follow instructions from your health care provider or dietitian about eating or drinking restrictions.  Eat a  healthy diet as told by your health care provider. This can help you reach and maintain a healthy weight, lower your LDL cholesterol, and raise your HDL cholesterol. This may include: ? Limiting your calories, if you are overweight. ? Eating more fruits, vegetables, whole grains, fish, and lean meats. ? Limiting saturated fat, trans fat, and cholesterol.  If you drink alcohol: ? Limit how much you use. ? Be aware of how much alcohol is in your drink. In the [place] one drink equals one 12 oz bottle of beer (355 mL), one 5 oz glass of wine (148 mL), or one 1 oz glass of hard liquor (44 mL).  Do not drink alcohol if: ? Your health care provider tells you not to drink. ? You are pregnant, may be pregnant, or are planning to become pregnant. Activity  Get regular exercise. Start an exercise and strength training program as told by your health care provider. Ask your health care provider what activities are safe for you. Your health care provider may recommend: ? 30 minutes of aerobic activity 4-6 days a week. Brisk walking is an example of aerobic activity. ? Strength training 2 days a week. General instructions  Do not use any products that contain nicotine or tobacco, such as cigarettes, e-cigarettes, and chewing tobacco. If you need help quitting, ask your health care provider.  Take over-the-counter and prescription medicines only as told by your health care provider. This includes supplements.  Keep all follow-up visits as told by your health care provider. Contact a health care provider if:  You are: ? Having trouble sticking to your exercise or diet plan. ? Struggling to quit smoking or control your use of alcohol. Summary  Dyslipidemia often involves a high level of cholesterol or triglycerides, which are types of lipids.  Treatment depends on the type of dyslipidemia that you have and your other risk factors for heart disease and stroke.  For many people, treatment starts with  lifestyle changes, such as diet and exercise.  Your health care provider may prescribe medicine to lower lipids. This information is not intended to replace advice given to you by your health care provider. Make sure you discuss any questions you have with your health care provider. Document Revised: 08/13/2017 Document Reviewed: 07/20/2017 Elsevier Patient Education  2020 [employer].   The 10-year ASCVD risk score Denman George DC Montez Hageman., et al., 2013)  is: 15.8%   Values used to calculate the score:     Age: 76 years     Sex: Male     Is Non-Hispanic African American: Yes     Diabetic: Yes     Tobacco smoker: Yes     Systolic Blood Pressure: 140 mmHg     Is BP treated: No     HDL Cholesterol: 53 mg/dL     Total Cholesterol: 147 mg/dL

## 2019-02-19 ENCOUNTER — Other Ambulatory Visit: Payer: Self-pay | Admitting: Family Medicine

## 2019-02-19 DIAGNOSIS — E119 Type 2 diabetes mellitus without complications: Secondary | ICD-10-CM

## 2019-02-19 DIAGNOSIS — R030 Elevated blood-pressure reading, without diagnosis of hypertension: Secondary | ICD-10-CM

## 2019-02-19 DIAGNOSIS — E559 Vitamin D deficiency, unspecified: Secondary | ICD-10-CM

## 2019-02-19 DIAGNOSIS — E785 Hyperlipidemia, unspecified: Secondary | ICD-10-CM

## 2019-02-19 LAB — CBC WITH DIFFERENTIAL/PLATELET
Absolute Monocytes: 704 cells/uL (ref 200–950)
Basophils Absolute: 48 cells/uL (ref 0–200)
Basophils Relative: 0.6 %
Eosinophils Absolute: 360 cells/uL (ref 15–500)
Eosinophils Relative: 4.5 %
HCT: 41.6 % (ref 38.5–50.0)
Hemoglobin: 13.5 g/dL (ref 13.2–17.1)
Lymphs Abs: 2128 cells/uL (ref 850–3900)
MCH: 28.8 pg (ref 27.0–33.0)
MCHC: 32.5 g/dL (ref 32.0–36.0)
MCV: 88.9 fL (ref 80.0–100.0)
MPV: 12.8 fL — ABNORMAL HIGH (ref 7.5–12.5)
Monocytes Relative: 8.8 %
Neutro Abs: 4760 cells/uL (ref 1500–7800)
Neutrophils Relative %: 59.5 %
Platelets: 179 10*3/uL (ref 140–400)
RBC: 4.68 10*6/uL (ref 4.20–5.80)
RDW: 11 % (ref 11.0–15.0)
Total Lymphocyte: 26.6 %
WBC: 8 10*3/uL (ref 3.8–10.8)

## 2019-02-19 LAB — LIPID PANEL
Cholesterol: 152 mg/dL (ref <200)
HDL: 61 mg/dL (ref >=40)
LDL Cholesterol (Calc): 77 mg/dL (calc)
Non-HDL Cholesterol (Calc): 91 mg/dL (calc) (ref <130)
Total CHOL/HDL Ratio: 2.5 (calc) (ref <5.0)
Triglycerides: 59 mg/dL (ref <150)

## 2019-02-19 LAB — COMPLETE METABOLIC PANEL WITH GFR
AG Ratio: 1.7 (calc) (ref 1.0–2.5)
ALT: 26 U/L (ref 9–46)
AST: 15 U/L (ref 10–40)
Albumin: 4.3 g/dL (ref 3.6–5.1)
Alkaline phosphatase (APISO): 76 U/L (ref 36–130)
BUN: 16 mg/dL (ref 7–25)
CO2: 25 mmol/L (ref 20–32)
Calcium: 9.8 mg/dL (ref 8.6–10.3)
Chloride: 108 mmol/L (ref 98–110)
Creat: 0.76 mg/dL (ref 0.60–1.35)
GFR, Est African American: 125 mL/min/{1.73_m2} (ref >=60)
GFR, Est Non African American: 108 mL/min/{1.73_m2} (ref >=60)
Globulin: 2.5 g/dL (calc) (ref 1.9–3.7)
Glucose, Bld: 190 mg/dL — ABNORMAL HIGH (ref 65–99)
Potassium: 4.6 mmol/L (ref 3.5–5.3)
Sodium: 138 mmol/L (ref 135–146)
Total Bilirubin: 0.4 mg/dL (ref 0.2–1.2)
Total Protein: 6.8 g/dL (ref 6.1–8.1)

## 2019-02-19 LAB — HEMOGLOBIN A1C
Hgb A1c MFr Bld: 8.4 %{Hb} — ABNORMAL HIGH
Mean Plasma Glucose: 194 (calc)
eAG (mmol/L): 10.8 (calc)

## 2019-02-19 LAB — VITAMIN D 25 HYDROXY (VIT D DEFICIENCY, FRACTURES): Vit D, 25-Hydroxy: 10 ng/mL — ABNORMAL LOW (ref 30–100)

## 2019-02-19 MED ORDER — ATORVASTATIN CALCIUM 20 MG PO TABS: 20.0000 mg | ORAL_TABLET | Freq: Every day | ORAL | 3 refills | Status: DC

## 2019-02-19 MED ORDER — VITAMIN D (ERGOCALCIFEROL) 1.25 MG (50000 UNIT) PO CAPS: 50000.0000 [IU] | ORAL_CAPSULE | ORAL | 0 refills | Status: DC

## 2019-02-21 ENCOUNTER — Encounter: Payer: Self-pay | Admitting: Family Medicine

## 2019-05-20 ENCOUNTER — Encounter: Payer: Self-pay | Admitting: Family Medicine

## 2019-05-20 ENCOUNTER — Ambulatory Visit: Payer: BC Managed Care – PPO | Admitting: Family Medicine

## 2019-05-20 ENCOUNTER — Other Ambulatory Visit: Payer: Self-pay

## 2019-05-20 VITALS — BP 124/70 | HR 96 | Temp 98.6°F | Resp 14 | Ht 66.0 in | Wt 251.5 lb

## 2019-05-20 DIAGNOSIS — E119 Type 2 diabetes mellitus without complications: Secondary | ICD-10-CM | POA: Diagnosis not present

## 2019-05-20 DIAGNOSIS — Z720 Tobacco use: Secondary | ICD-10-CM

## 2019-05-20 DIAGNOSIS — Z5181 Encounter for therapeutic drug level monitoring: Secondary | ICD-10-CM | POA: Diagnosis not present

## 2019-05-20 DIAGNOSIS — E559 Vitamin D deficiency, unspecified: Secondary | ICD-10-CM | POA: Diagnosis not present

## 2019-05-20 DIAGNOSIS — Z6841 Body Mass Index (BMI) 40.0 and over, adult: Secondary | ICD-10-CM

## 2019-05-20 DIAGNOSIS — E785 Hyperlipidemia, unspecified: Secondary | ICD-10-CM

## 2019-05-20 DIAGNOSIS — E66813 Obesity, class 3: Secondary | ICD-10-CM

## 2019-05-20 LAB — POCT GLYCOSYLATED HEMOGLOBIN (HGB A1C): Hemoglobin A1C: 7.3 % — AB (ref 4.0–5.6)

## 2019-05-20 NOTE — Progress Notes (Signed)
Name: Alvin Green   MRN: [id number]    DOB: 1970-04-11   Date:05/20/2019       Progress Note  Chief Complaint  Patient presents with  . Follow-up    never started meds   . Diabetes  . Hyperlipidemia     Subjective:   Alvin Green is a 49 y.o. male, presents to clinic for routine follow up on the conditions listed above.  DM- A1C 3 months ago was 8.4 and pt was given metformin to start, today he reports that he did not start any meds.  Not taking lipitor or metformin He has cut out soda - pepsi, eating more baked chicken and salads Main thing he has now as a sweet is sugar in his coffee Diabetes Mellitus Type II: Hx of being uncontrolled Currently managing with nothing-patient was prescribed Metformin but he did not want to take it No hypoglycemic episodes Denies: Polyuria, polydipsia, polyphagia, vision changes, or neuropathy  Recent pertinent labs: Lab Results  Component Value Date   HGBA1C 7.3 (A) 05/20/2019   HGBA1C 8.4 (H) 02/18/2019   HGBA1C 8.2 (A) 02/18/2019  Pt is up-to-date on DM foot exam and due for eye exam ACEI/ARB: No -patient refused  statin: No -patient refused  He had Vit D deficiency and completed the Rx dose supplement Vit D level was 10, over the past 3 years has been very low 10-15  Only on 81 mg ASA  Smoking 3 packs a week He has quit smoking before, he knows he can do it if he wants to and he currently is not interested in smoking cessation or stopping all the way discussed again today   Patient Active Problem List   Diagnosis Date Noted  . Tobacco abuse 04/05/2018  . Acquired trigger finger 08/02/2017  . Sprain of metacarpophalangeal joint 08/02/2017  . Obstructive sleep apnea 07/20/2017  . Vitamin D deficiency 03/15/2017  . Type 2 diabetes mellitus ([place]) 04/28/2016  . Class 3 severe obesity without serious comorbidity with body mass index (BMI) of 40.0 to 44.9 in adult [facility]) 09/28/2014    Past Surgical History:    Procedure Laterality Date  . CARPAL TUNNEL RELEASE    . CARPAL TUNNEL RELEASE Right    approximately 10 years ago    Family History  Problem Relation Age of Onset  . Hypertension Mother   . Sleep disorder Father   . Hypertension Brother     Social History   Tobacco Use  . Smoking status: Current Every Day Smoker    Packs/day: 0.50    Years: 20.00    Pack years: 10.00    Types: Cigarettes  . Smokeless tobacco: Never Used  Substance Use Topics  . Alcohol use: Yes    Alcohol/week: 0.0 standard drinks    Comment: occasionally  . Drug use: No      Current Outpatient Medications:  .  aspirin EC 81 MG tablet, Take 1 tablet (81 mg total) by mouth daily., Disp: 90 tablet, Rfl: 0 .  atorvastatin (LIPITOR) 20 MG tablet, Take 1 tablet (20 mg total) by mouth at bedtime., Disp: 90 tablet, Rfl: 3 .  metFORMIN (GLUCOPHAGE XR) 500 MG 24 hr tablet, Take 1 tablet (500 mg total) by mouth 2 (two) times daily with a meal., Disp: 180 tablet, Rfl: 2 .  terbinafine (LAMISIL) 250 MG tablet, Take 1 tablet (250 mg total) by mouth daily., Disp: 90 tablet, Rfl: 0 .  Vitamin D, Ergocalciferol, (DRISDOL) 1.25 MG (50000 UNIT)  CAPS capsule, Take 1 capsule (50,000 Units total) by mouth every 7 (seven) days. x12 weeks., Disp: 12 capsule, Rfl: 0  No Known Allergies  Chart Review Today: I personally reviewed active problem list, medication list, allergies, family history, social history, health maintenance, notes from last encounter, lab results, imaging with the patient/caregiver today.   Review of Systems  10 Systems reviewed and are negative for acute change except as noted in the HPI.  Objective:    Vitals:   05/20/19 0908  BP: 124/70  Pulse: 96  Resp: 14  Temp: 98.6 F (37 C)  SpO2: 98%  Weight: 251 lb 8 oz (114.1 kg)  Height: 5\' 6"  (1.676 m)    Body mass index is 40.59 kg/m.  Physical Exam Vitals and nursing note reviewed.  Constitutional:      General: He is not in acute  distress.    Appearance: Normal appearance. He is well-developed. He is not ill-appearing, toxic-appearing or diaphoretic.  HENT:     Head: Normocephalic and atraumatic.     Right Ear: External ear normal.     Left Ear: External ear normal.  Eyes:     General:        Right eye: No discharge.        Left eye: No discharge.     Conjunctiva/sclera: Conjunctivae normal.  Neck:     Trachea: No tracheal deviation.  Cardiovascular:     Rate and Rhythm: Normal rate and regular rhythm.     Pulses: Normal pulses.     Heart sounds: Normal heart sounds. No murmur. No friction rub. No gallop.   Pulmonary:     Effort: Pulmonary effort is normal. No respiratory distress.     Breath sounds: Normal breath sounds. No stridor.  Musculoskeletal:     Right lower leg: No edema.     Left lower leg: No edema.  Skin:    General: Skin is warm and dry.     Findings: No rash.  Neurological:     Mental Status: He is alert.     Motor: No abnormal muscle tone.     Coordination: Coordination normal.     Gait: Gait normal.  Psychiatric:        Mood and Affect: Mood normal.        Behavior: Behavior normal.       PHQ2/9: Depression screen [facility] 2/9 02/18/2019 08/14/2018 04/05/2018 03/04/2018 01/29/2018  Decreased Interest 0 0 0 0 0  Down, Depressed, Hopeless 0 0 0 0 0  PHQ - 2 Score 0 0 0 0 0  Altered sleeping 0 0 0 0 0  Tired, decreased energy 0 0 0 0 0  Change in appetite 0 0 0 0 0  Feeling bad or failure about yourself  0 0 0 0 0  Trouble concentrating 0 0 0 0 0  Moving slowly or fidgety/restless 0 0 0 0 0  Suicidal thoughts 0 0 0 0 0  PHQ-9 Score 0 0 0 0 0  Difficult doing work/chores Not difficult at all Not difficult at all Not difficult at all Not difficult at all Not difficult at all    phq 9 is negative, reviewed today  Fall Risk: Fall Risk  02/18/2019 08/14/2018 04/05/2018 03/04/2018 01/29/2018  Falls in the past year? 0 0 0 0 0  Number falls in past yr: 0 0 - 0 0  Injury with Fall? 0 0 - 0 0     Functional Status Survey:  Assessment & Plan:   1. Type 2 diabetes mellitus without complication, without Fadness-term current use of insulin (HCC) Uncontrolled at last visit, not on medications, he was given Metformin to restart but he did not restart medications in the past 3 months.  Here for follow-up his A1c did improve from 8.4-7.3 but is not yet at goal, he does not want to start medicines right now he will continue to work on his diet and exercise is cut out carbs and sugar - POCT HgB A1C  Labs ordered for next follow-up visit: - Hemoglobin A1c - COMPLETE METABOLIC PANEL WITH GFR - Lipid panel  2. Vitamin D deficiency History of vitamin D deficiency very low for several years he did take and complete prescription dose vitamin D weekly supplement for the past 3 months he has completed this and has not started a daily over-the-counter supplement like he was instructed to do.  He states that he is out in the sunlight now and he feels that his vitamin D will be maintained he is also drinking orange juice (?)  For vitamin D supplementation -he agreed to check labs to see if he needs to do any further prescription dose supplements going forward - VITAMIN D 25 Hydroxy (Vit-D Deficiency, Fractures)  3. Encounter for medication monitoring - Hemoglobin A1c - COMPLETE METABOLIC PANEL WITH GFR - Lipid panel  4. Class 3 severe obesity without serious comorbidity with body mass index (BMI) of 40.0 to 44.9 in adult, unspecified obesity type (HCC) He has lost a few pounds since our last office visit but over the past several years his weight has been fairly steady around 250 Courage him to work on diet and exercise, explained how metabolic syndrome can cause diabetes insulin resistance and if he is able to lose some weight some of those abnormal metabolic processes will be able to improve - Hemoglobin A1c - COMPLETE METABOLIC PANEL WITH GFR - Lipid panel  5. Tobacco abuse Continued  smoker, no interest in currently smoking although we discussed that again today  6. Hyperlipidemia, unspecified hyperlipidemia type Previously discussed his high ASCVD risk score with smoking history of diabetes and his last lipid panel, he was prescribed statin medication which he did not take over the past 3 months it was removed from his chart today, discussed again the benefits of the medication and side effects.  Patient has been instructed to and medically advised to start a statin medication but he does not wish to at this time The 10-year ASCVD risk score Denman George DC Montez Hageman., et al., 2013) is: 12.5%   Values used to calculate the score:     Age: 65 years     Sex: Male     Is Non-Hispanic African American: Yes     Diabetic: Yes     Tobacco smoker: Yes     Systolic Blood Pressure: 124 mmHg     Is BP treated: No     HDL Cholesterol: 61 mg/dL     Total Cholesterol: 152 mg/dL  - COMPLETE METABOLIC PANEL WITH GFR - Lipid panel  Return for 6 month f/up - labs already ordered.   Danelle Berry, PA-C 05/20/19 9:11 AM

## 2019-05-21 LAB — LIPID PANEL
Cholesterol: 157 mg/dL (ref <200)
HDL: 65 mg/dL (ref >=40)
LDL Cholesterol (Calc): 78 mg/dL (calc)
Non-HDL Cholesterol (Calc): 92 mg/dL (calc) (ref <130)
Total CHOL/HDL Ratio: 2.4 (calc) (ref <5.0)
Triglycerides: 67 mg/dL (ref <150)

## 2019-05-21 LAB — COMPLETE METABOLIC PANEL WITH GFR
AG Ratio: 1.6 (calc) (ref 1.0–2.5)
ALT: 25 U/L (ref 9–46)
AST: 16 U/L (ref 10–40)
Albumin: 4.3 g/dL (ref 3.6–5.1)
Alkaline phosphatase (APISO): 67 U/L (ref 36–130)
BUN: 17 mg/dL (ref 7–25)
CO2: 26 mmol/L (ref 20–32)
Calcium: 9.7 mg/dL (ref 8.6–10.3)
Chloride: 105 mmol/L (ref 98–110)
Creat: 0.89 mg/dL (ref 0.60–1.35)
GFR, Est African American: 117 mL/min/{1.73_m2} (ref >=60)
GFR, Est Non African American: 101 mL/min/{1.73_m2} (ref >=60)
Globulin: 2.7 g/dL (calc) (ref 1.9–3.7)
Glucose, Bld: 189 mg/dL — ABNORMAL HIGH (ref 65–99)
Potassium: 4.5 mmol/L (ref 3.5–5.3)
Sodium: 137 mmol/L (ref 135–146)
Total Bilirubin: 0.4 mg/dL (ref 0.2–1.2)
Total Protein: 7 g/dL (ref 6.1–8.1)

## 2019-05-21 LAB — HEMOGLOBIN A1C
Hgb A1c MFr Bld: 7.8 % of total Hgb — ABNORMAL HIGH (ref <5.7)
Mean Plasma Glucose: 177 (calc)
eAG (mmol/L): 9.8 (calc)

## 2019-05-21 LAB — VITAMIN D 25 HYDROXY (VIT D DEFICIENCY, FRACTURES): Vit D, 25-Hydroxy: 17 ng/mL — ABNORMAL LOW (ref 30–100)

## 2019-05-22 MED ORDER — METFORMIN HCL 500 MG PO TABS: 500.0000 mg | ORAL_TABLET | Freq: Two times a day (BID) | ORAL | 3 refills | Status: DC

## 2019-05-22 MED ORDER — VITAMIN D (ERGOCALCIFEROL) 1.25 MG (50000 UNIT) PO CAPS: 50000.0000 [IU] | ORAL_CAPSULE | ORAL | 0 refills | Status: DC

## 2019-05-22 NOTE — Addendum Note (Signed)
Addended by: Danelle Berry on: 05/22/2019 06:51 PM   Modules accepted: Orders

## 2019-05-23 ENCOUNTER — Telehealth: Payer: Self-pay

## 2019-05-23 DIAGNOSIS — Z5181 Encounter for therapeutic drug level monitoring: Secondary | ICD-10-CM

## 2019-05-23 DIAGNOSIS — E119 Type 2 diabetes mellitus without complications: Secondary | ICD-10-CM

## 2019-05-23 DIAGNOSIS — E785 Hyperlipidemia, unspecified: Secondary | ICD-10-CM

## 2019-05-23 NOTE — Telephone Encounter (Signed)
-----   Message from Danelle Berry, [place] sent at 05/22/2019  6:50 PM EDT ----- Please notify pt of labwork   Please let him know that unfortunately the labs I had ordered to hold for the next time that he comes they were done today so he had his A1c done twice.  The labs shows that his A1c is 7.8 which is worse than with the POC lab shows, the point-of-care labs are more inaccurate so his diabetes is likely more uncontrolled than what we discussed at his appointment  Vitamin D is low, I will send prescription vitamin D to  pharmacy and once finished she/he needs to take otc vitamin D 2000 units daily  I would recommend starting Metformin with A1c of 7.8  Jamie please reorder lipid panel, CMP and hemoglobin A1c for future labs for patient to come and recheck his improvement with lifestyle changes he was going to do this in the next 3 to 6 months (reorder the way I had ordered them thanks)

## 2019-05-28 ENCOUNTER — Ambulatory Visit: Payer: BLUE CROSS/BLUE SHIELD | Attending: Internal Medicine

## 2019-05-28 DIAGNOSIS — Z23 Encounter for immunization: Secondary | ICD-10-CM

## 2019-05-28 NOTE — Progress Notes (Signed)
   Covid-19 Vaccination Clinic  Name:  Alvin Green    MRN: [id number] DOB: 1970/04/15  05/28/2019  Alvin Green was observed post Covid-19 immunization for 15 minutes without incident. He was provided with Vaccine Information Sheet and instruction to access the V-Safe system.   Alvin Green was instructed to call 911 with any severe reactions post vaccine: Marland Kitchen Difficulty breathing  . Swelling of face and throat  . A fast heartbeat  . A bad rash all over body  . Dizziness and weakness   Immunizations Administered    Name Date Dose VIS Date Route   Pfizer COVID-19 Vaccine 05/28/2019  5:01 PM 0.3 mL 02/26/2018 Intramuscular   Manufacturer: [employer], [employer]   Lot: [id number]   NDC: [address]-0

## 2019-06-18 ENCOUNTER — Ambulatory Visit: Payer: Self-pay | Attending: Internal Medicine

## 2019-06-18 DIAGNOSIS — Z23 Encounter for immunization: Secondary | ICD-10-CM

## 2019-06-18 NOTE — Progress Notes (Signed)
   Covid-19 Vaccination Clinic  Name:  Alvin Green    MRN: [id number] DOB: [birth year]  06/18/2019  [name] was observed post Covid-19 immunization for 15 minutes without incident. He was provided with Vaccine Information Sheet and instruction to access the V-Safe system.   [name] was instructed to call 911 with any severe reactions post vaccine: Marland Kitchen Difficulty breathing  . Swelling of face and throat  . A fast heartbeat  . A bad rash all over body  . Dizziness and weakness   Immunizations Administered    Name Date Dose VIS Date Route   Pfizer COVID-19 Vaccine 06/18/2019  4:59 PM 0.3 mL 02/26/2018 Intramuscular   Manufacturer: [employer], [employer]   Lot: [id number]   NDC: [address]-9

## 2019-08-19 ENCOUNTER — Other Ambulatory Visit: Payer: Self-pay

## 2019-08-19 ENCOUNTER — Encounter: Payer: Self-pay | Admitting: Family Medicine

## 2019-08-19 ENCOUNTER — Ambulatory Visit (INDEPENDENT_AMBULATORY_CARE_PROVIDER_SITE_OTHER): Payer: BC Managed Care – PPO | Admitting: Family Medicine

## 2019-08-19 VITALS — BP 110/78 | HR 88 | Temp 98.3°F | Resp 18 | Ht 66.0 in | Wt 250.1 lb

## 2019-08-19 DIAGNOSIS — E119 Type 2 diabetes mellitus without complications: Secondary | ICD-10-CM | POA: Diagnosis not present

## 2019-08-19 DIAGNOSIS — G4733 Obstructive sleep apnea (adult) (pediatric): Secondary | ICD-10-CM

## 2019-08-19 DIAGNOSIS — M25572 Pain in left ankle and joints of left foot: Secondary | ICD-10-CM

## 2019-08-19 DIAGNOSIS — Z1159 Encounter for screening for other viral diseases: Secondary | ICD-10-CM

## 2019-08-19 DIAGNOSIS — Z0000 Encounter for general adult medical examination without abnormal findings: Secondary | ICD-10-CM

## 2019-08-19 DIAGNOSIS — Z720 Tobacco use: Secondary | ICD-10-CM

## 2019-08-19 DIAGNOSIS — Z6841 Body Mass Index (BMI) 40.0 and over, adult: Secondary | ICD-10-CM

## 2019-08-19 NOTE — Progress Notes (Signed)
Patient: Alvin HurryCharles Decarolos Green, Male    DOB: 05/09/1970, 49 y.o.   MRN: [id number] Danelle Berryapia, Almee Pelphrey, PA-C Visit Date: 08/19/2019  Today's Provider: Danelle BerryLeisa Olvin Rohr, PA-C   Chief Complaint  Patient presents with  . Annual Exam   Subjective:   Annual physical exam:  Alvin GrahamCharles Decarolos Biddinger is a 49 y.o. male who presents today for health maintenance and annual & complete physical exam.   He feels well.  He reports exercising- none Diet generally described by pt as poor - in the past 3 months he has tried to eat more salads, decrease eating out, baked foods more and he generally does not have a sweet tooth  He reports he is sleeping well.  DM - did not want to restart metformin,m working on diet Lab Results  Component Value Date   HGBA1C 7.8 (H) 05/20/2019     USPSTF grade A and B recommendations - reviewed and addressed today  Depression:  Phq 9 completed today by patient, was reviewed by me with patient in the room, score is  negative, pt feels good, no depression concerns PHQ 2/9 Scores 08/19/2019 05/20/2019 02/18/2019 08/14/2018  PHQ - 2 Score 0 0 0 0  PHQ- 9 Score 0 0 0 0   [facility] 08/19/2019 05/20/2019 02/18/2019 08/14/2018 04/05/2018  Decreased Interest 0 0 0 0 0  Down, Depressed, Hopeless 0 0 0 0 0  PHQ - 2 Score 0 0 0 0 0  Altered sleeping 0 0 0 0 0  Tired, decreased energy 0 0 0 0 0  Change in appetite 0 0 0 0 0  Feeling bad or failure about yourself  0 0 0 0 0  Trouble concentrating 0 0 0 0 0  Moving slowly or fidgety/restless 0 0 0 0 0  Suicidal thoughts 0 0 0 0 0  PHQ-9 Score 0 0 0 0 0  Difficult doing work/chores Not difficult at all Not difficult at all Not difficult at all Not difficult at all Not difficult at all  Some recent data might be hidden   Hep C Screening:  ordered today  STD testing and prevention (HIV/chl/gon/syphilis):  no   Prostate cancer: no Lab Results  Component Value Date   PSA 0.5 10/02/2016  Prostate cancer screening with  PSA: Discussed risks and benefits of PSA testing and provided handout. Pt and provider decided to NOT have PSA drawn today.  Intimate partner violence:  no   Urinary Symptoms:   IPSS Questionnaire (AUA-7): Over the past month.   1)  How often have you had a sensation of not emptying your bladder completely after you finish urinating?  0 - Not at all  2)  How often have you had to urinate again less than two hours after you finished urinating? 0 - Not at all  3)  How often have you found you stopped and started again several times when you urinated?  0 - Not at all  4) How difficult have you found it to postpone urination?  0 - Not at all  5) How often have you had a weak urinary stream?  0 - Not at all  6) How often have you had to push or strain to begin urination?  0 - Not at all  7) How many times did you most typically get up to urinate from the time you went to bed until the time you got up in the morning?  1 - 1 time  Total score:  0-7 mildly symptomatic   8-19 moderately symptomatic   20-35 severely symptomatic   Advanced Care Planning:  A voluntary discussion about advance care planning including the explanation and discussion of advance directives.  Discussed health care proxy and Living will, and the patient was able to identify a health care proxy as Beryle Flock .  Patient does not have a living will at present time. If patient does have living will, I have requested they bring this to the clinic to be scanned in to their chart.  Health Maintenance  Topic Date Due  . Hepatitis C Screening  Never done  . OPHTHALMOLOGY EXAM  06/13/2017  . URINE MICROALBUMIN  08/22/2019  . INFLUENZA VACCINE  09/08/2019 (Originally 08/03/2019)  . PNEUMOCOCCAL POLYSACCHARIDE VACCINE AGE 46-64 HIGH RISK  02/18/2020 (Originally 06/03/1972)  . TETANUS/TDAP  02/18/2020 (Originally 06/03/1989)  . HIV Screening  02/18/2020 (Originally 06/03/1985)  . HEMOGLOBIN A1C  11/20/2019  . FOOT EXAM  02/18/2020  .  COVID-19 Vaccine  Completed    Skin cancer:  last skin survey was.  Pt reports no hx of skin cancer, suspicious lesions/biopsies in the past.  Colorectal cancer:  colonoscopy is pt declined - discussed options colonoscopy, hemoccult, cologuard Denies melena, hematochezia, change in BM   Lung cancer:   Low Dose CT Chest recommended if Age 88-80 years, 30 pack-year currently smoking OR have quit w/in 15years. Patient does not qualify.   Social History   Tobacco Use  . Smoking status: Current Every Day Smoker    Packs/day: 0.50    Years: 20.00    Pack years: 10.00    Types: Cigarettes  . Smokeless tobacco: Never Used  Substance Use Topics  . Alcohol use: Yes    Alcohol/week: 0.0 standard drinks    Comment: occasionally     Alcohol screening:   [facility] from 08/19/2019 in [facility]  AUDIT-C Score 0     AAA:   not of age The USPSTF recommends one-time screening with ultrasonography in men ages 82 to 40 years who have ever smoked  ECG:  Not indicated  Blood pressure/Hypertension: BP Readings from Last 3 Encounters:  08/19/19 110/78  05/20/19 124/70  02/18/19 140/86    Weight/Obesity: Wt Readings from Last 3 Encounters:  08/19/19 250 lb 1.6 oz (113.4 kg)  05/20/19 251 lb 8 oz (114.1 kg)  02/18/19 254 lb 3.2 oz (115.3 kg)   BMI Readings from Last 3 Encounters:  08/19/19 40.37 kg/m  05/20/19 40.59 kg/m  02/18/19 41.03 kg/m    Lipids:  Lab Results  Component Value Date   CHOL 157 05/20/2019   CHOL 152 02/18/2019   CHOL 147 08/22/2018   Lab Results  Component Value Date   HDL 65 05/20/2019   HDL 61 02/18/2019   HDL 53 08/22/2018   Lab Results  Component Value Date   LDLCALC 78 05/20/2019   LDLCALC 77 02/18/2019   LDLCALC 80 08/22/2018   Lab Results  Component Value Date   TRIG 67 05/20/2019   TRIG 59 02/18/2019   TRIG 61 08/22/2018   Lab Results  Component Value Date   CHOLHDL 2.4 05/20/2019   CHOLHDL 2.5 02/18/2019    CHOLHDL 2.8 08/22/2018   No results found for: LDLDIRECT Based on the results of lipid panel his/her cardiovascular risk factor ( using [place] Cohort )  in the next 10 years is :  The 10-year ASCVD risk score Denman George DC Jr., et al., 2013) is: 10.6%   Values  used to calculate the score:     Age: 14 years     Sex: Male     Is Non-Hispanic African American: Yes     Diabetic: Yes     Tobacco smoker: Yes     Systolic Blood Pressure: 110 mmHg     Is BP treated: No     HDL Cholesterol: 65 mg/dL     Total Cholesterol: 157 mg/dL  Glucose:  Glucose, Bld  Date Value Ref Range Status  05/20/2019 189 (H) 65 - 99 mg/dL Final    Comment:    .            Fasting reference interval . For someone without known diabetes, a glucose value >125 mg/dL indicates that they may have diabetes and this should be confirmed with a follow-up test. .   02/18/2019 190 (H) 65 - 99 mg/dL Final    Comment:    .            Fasting reference interval . For someone without known diabetes, a glucose value >125 mg/dL indicates that they may have diabetes and this should be confirmed with a follow-up test. .   08/22/2018 122 (H) 65 - 99 mg/dL Final    Comment:    .            Fasting reference interval . For someone without known diabetes, a glucose value between 100 and 125 mg/dL is consistent with prediabetes and should be confirmed with a follow-up test. .     Social History      He  reports that he has been smoking cigarettes. He has a 10.00 pack-year smoking history. He has never used smokeless tobacco. He reports current alcohol use. He reports that he does not use drugs.       Social History   Socioeconomic History  . Marital status: Married    Spouse name: Bronson Ing  . Number of children: 2  . Years of education: Not on file  . Highest education level: Some college, no degree  Occupational History  . Not on file  Tobacco Use  . Smoking status: Current Every Day Smoker    Packs/day: 0.50     Years: 20.00    Pack years: 10.00    Types: Cigarettes  . Smokeless tobacco: Never Used  Vaping Use  . Vaping Use: Never used  Substance and Sexual Activity  . Alcohol use: Yes    Alcohol/week: 0.0 standard drinks    Comment: occasionally  . Drug use: No  . Sexual activity: Yes    Partners: Female    Birth control/protection: None  Other Topics Concern  . Not on file  Social History Narrative   Works at [employer]:   . Difficulty of Paying Living Expenses:   Food Insecurity:   . Worried About Programme researcher, broadcasting/film/video in the Last Year:   . Barista in the Last Year:   Transportation Needs:   . Freight forwarder (Medical):   Marland Kitchen Lack of Transportation (Non-Medical):   Physical Activity:   . Days of Exercise per Week:   . Minutes of Exercise per Session:   Stress:   . Feeling of Stress :   Social Connections:   . Frequency of Communication with Friends and Family:   . Frequency of Social Gatherings with Friends and Family:   . Attends Religious Services:   . Active  Member of Clubs or Organizations:   . Attends Banker Meetings:   Marland Kitchen Marital Status:         Family History        Family Status  Relation Name Status  . Mother  Alive  . Father  Alive  . Sister  Alive  . Brother  Alive  . Daughter [place] Alive  . Son Christobal Morado        His family history includes Hypertension in his brother and mother; Sleep disorder in his father.       Family History  Problem Relation Age of Onset  . Hypertension Mother   . Sleep disorder Father   . Hypertension Brother     Patient Active Problem List   Diagnosis Date Noted  . Tobacco abuse 04/05/2018  . Acquired trigger finger 08/02/2017  . Sprain of metacarpophalangeal joint 08/02/2017  . Obstructive sleep apnea 07/20/2017  . Vitamin D deficiency 03/15/2017  . Type 2 diabetes mellitus (HCC) 04/28/2016  . Class 3 severe obesity  without serious comorbidity with body mass index (BMI) of 40.0 to 44.9 in adult [facility]) 09/28/2014    Past Surgical History:  Procedure Laterality Date  . CARPAL TUNNEL RELEASE    . CARPAL TUNNEL RELEASE Right    approximately 10 years ago     Current Outpatient Medications:  .  aspirin EC 81 MG tablet, Take 1 tablet (81 mg total) by mouth daily., Disp: 90 tablet, Rfl: 0 .  metFORMIN (GLUCOPHAGE) 500 MG tablet, Take 1 tablet (500 mg total) by mouth 2 (two) times daily with a meal. (Patient not taking: Reported on 08/19/2019), Disp: 180 tablet, Rfl: 3  No Known Allergies  Patient Care Team: Danelle Berry, PA-C as PCP - General (Family Medicine)   I personally reviewed active problem list, medication list, allergies, family history, social history, health maintenance, notes from last encounter, lab results, imaging with the patient/caregiver today.   Review of Systems  Constitutional: Negative.   HENT: Negative.   Eyes: Negative.   Respiratory: Negative.   Cardiovascular: Negative.   Gastrointestinal: Negative.   Endocrine: Negative.   Genitourinary: Negative.   Musculoskeletal: Negative.   Skin: Negative.   Allergic/Immunologic: Negative.   Neurological: Negative.   Hematological: Negative.   Psychiatric/Behavioral: Negative.   All other systems reviewed and are negative.         Objective:   Vitals:  Vitals:   08/19/19 0905  BP: 110/78  Pulse: 88  Resp: 18  Temp: 98.3 F (36.8 C)  SpO2: 97%  Weight: 250 lb 1.6 oz (113.4 kg)  Height:  (1.676 m)    Body mass index is 40.37 kg/m.  Physical Exam Vitals and nursing note reviewed.  Constitutional:      General: He is not in acute distress.    Appearance: Normal appearance. He is well-developed. He is obese. He is not ill-appearing, toxic-appearing or diaphoretic.     Interventions: Face mask in place.  HENT:     Head: Normocephalic and atraumatic.     Jaw: No trismus.     Right Ear: External ear  normal.     Left Ear: External ear normal.  Eyes:     General: Lids are normal. No scleral icterus.       Right eye: No discharge.        Left eye: No discharge.     Conjunctiva/sclera: Conjunctivae normal.  Neck:     Thyroid: No thyroid mass, thyromegaly or  thyroid tenderness.     Trachea: Trachea and phonation normal. No tracheal deviation.  Cardiovascular:     Rate and Rhythm: Normal rate and regular rhythm.     Pulses: Normal pulses.          Radial pulses are 2+ on the right side and 2+ on the left side.       Posterior tibial pulses are 2+ on the right side and 2+ on the left side.     Heart sounds: Normal heart sounds. No murmur heard.  No friction rub. No gallop.   Pulmonary:     Effort: Pulmonary effort is normal. No respiratory distress.     Breath sounds: Normal breath sounds. No stridor. No wheezing, rhonchi or rales.  Abdominal:     General: Bowel sounds are normal. There is no distension.     Palpations: Abdomen is soft. There is no mass.     Tenderness: There is no abdominal tenderness. There is no right CVA tenderness, left CVA tenderness or guarding.     Hernia: No hernia is present.  Musculoskeletal:     Cervical back: Full passive range of motion without pain, normal range of motion and neck supple.     Right lower leg: No edema.     Left lower leg: No edema.  Skin:    General: Skin is warm and dry.     Coloration: Skin is not jaundiced or pale.     Findings: No rash.     Nails: There is no clubbing.  Neurological:     Mental Status: He is alert. Mental status is at baseline.     Cranial Nerves: No dysarthria or facial asymmetry.     Motor: No weakness, tremor or abnormal muscle tone.     Coordination: Coordination normal.     Gait: Gait normal.  Psychiatric:        Mood and Affect: Mood normal.        Speech: Speech normal.        Behavior: Behavior normal. Behavior is cooperative.      No results found for this or any previous visit (from the past  2160 hour(s)).   PHQ2/9: Depression screen [facility] 2/9 08/19/2019 05/20/2019 02/18/2019 08/14/2018 04/05/2018  Decreased Interest 0 0 0 0 0  Down, Depressed, Hopeless 0 0 0 0 0  PHQ - 2 Score 0 0 0 0 0  Altered sleeping 0 0 0 0 0  Tired, decreased energy 0 0 0 0 0  Change in appetite 0 0 0 0 0  Feeling bad or failure about yourself  0 0 0 0 0  Trouble concentrating 0 0 0 0 0  Moving slowly or fidgety/restless 0 0 0 0 0  Suicidal thoughts 0 0 0 0 0  PHQ-9 Score 0 0 0 0 0  Difficult doing work/chores Not difficult at all Not difficult at all Not difficult at all Not difficult at all Not difficult at all  Some recent data might be hidden    Fall Risk: Fall Risk  08/19/2019 05/20/2019 02/18/2019 08/14/2018 04/05/2018  Falls in the past year? 0 0 0 0 0  Number falls in past yr: 0 0 0 0 -  Injury with Fall? 0 0 0 0 -    Functional Status Survey: Is the patient deaf or have difficulty hearing?: No Does the patient have difficulty seeing, even when wearing glasses/contacts?: No Does the patient have difficulty concentrating, remembering, or making decisions?: No Does the patient  have difficulty walking or climbing stairs?: No Does the patient have difficulty dressing or bathing?: No Does the patient have difficulty doing errands alone such as visiting a doctor's office or shopping?: No   Assessment & Plan:    CPE completed today  . Prostate cancer screening and PSA options (with potential risks and benefits of testing vs not testing) were discussed along with recent recs/guidelines, shared decision making and handout/information given to pt today  . USPSTF grade A and B recommendations reviewed with patient; age-appropriate recommendations, preventive care, screening tests, etc discussed and encouraged; healthy living encouraged; see AVS for patient education given to patient  . Discussed importance of 150 minutes of physical activity weekly, AHA exercise recommendations given to pt in  AVS/handout  . Discussed importance of healthy diet:  eating lean meats and proteins, avoiding trans fats and saturated fats, avoid simple sugars and excessive carbs in diet, eat 6 servings of fruit/vegetables daily and drink plenty of water and avoid sweet beverages.  DASH diet reviewed if pt has HTN  . Recommended pt to do annual eye exam and routine dental exams/cleanings  . Reviewed Health Maintenance: Health Maintenance  Topic Date Due  . Hepatitis C Screening  Never done  . OPHTHALMOLOGY EXAM  06/13/2017  . URINE MICROALBUMIN  08/22/2019  . INFLUENZA VACCINE  09/08/2019 (Originally 08/03/2019)  . PNEUMOCOCCAL POLYSACCHARIDE VACCINE AGE 72-64 HIGH RISK  02/18/2020 (Originally 06/03/1972)  . TETANUS/TDAP  02/18/2020 (Originally 06/03/1989)  . HIV Screening  02/18/2020 (Originally 06/03/1985)  . HEMOGLOBIN A1C  11/20/2019  . FOOT EXAM  02/18/2020  . COVID-19 Vaccine  Completed    . Immunizations: Immunization History  Administered Date(s) Administered  . Influenza,inj,Quad PF,6+ Mos 09/16/2014  . PFIZER SARS-COV-2 Vaccination 05/28/2019, 06/18/2019    .  Recommended pt to return in late Sept or October to clinic for flu shot/flu clinic    ICD-10-CM   1. Adult general medical exam  Z00.00 Hemoglobin A1c    COMPLETE METABOLIC PANEL WITH GFR  2. Encounter for hepatitis C screening test for low risk patient  Z11.59 Hepatitis C antibody  3. Type 2 diabetes mellitus without complication, without [place]-term current use of insulin (HCC)  E11.9 Microalbumin, urine    Hemoglobin A1c    COMPLETE METABOLIC PANEL WITH GFR    Ambulatory referral to Ophthalmology   uncontrolled at last visit, pt wanted to work on diet and not take metformin, recheck A1C here today, not checking sugars  4. Obstructive sleep apnea  G47.33    compliant with CPAP  5. Class 3 severe obesity without serious comorbidity with body mass index (BMI) of 40.0 to 44.9 in adult, unspecified obesity type (HCC)  E66.01    Z68.41     encouraged to continue to work on diet and exercise  6. Tobacco abuse  Z72.0    smoking cessation discussed today, offered meds/resources, declined  7. Left ankle pain, unspecified chronicity  M25.572    left ankle pain, s/p old fracture - starting to have sx again, would like to f/up with Podiatry - [place]   Declined all vaccines today, declined CRC screening today Invited to return for nurse visit for flu shot and pneumoccocal vaccine  Refuses statin - ASCVD risk score discussed and reviewed with pt    Danelle Berry, PA-C 08/19/19 9:16 AM  [facility]

## 2019-08-19 NOTE — Patient Instructions (Signed)
Health Maintenance  Topic Date Due  . Hepatitis C Screening  Never done  . OPHTHALMOLOGY EXAM  06/13/2017  . URINE MICROALBUMIN  08/22/2019  . INFLUENZA VACCINE  09/08/2019 (Originally 08/03/2019)  . PNEUMOCOCCAL POLYSACCHARIDE VACCINE AGE 49-64 HIGH RISK  02/18/2020 (Originally 06/03/1972)  . TETANUS/TDAP  02/18/2020 (Originally 06/03/1989)  . HIV Screening  02/18/2020 (Originally 06/03/1985)  . HEMOGLOBIN A1C  11/20/2019  . FOOT EXAM  02/18/2020  . COVID-19 Vaccine  Completed   Please let me know if you would like to do colon cancer screening - there are several options available     Preventive Care 24-30 Years Old, Male Preventive care refers to lifestyle choices and visits with your health care provider that can promote health and wellness. This includes:  A yearly physical exam. This is also called an annual well check.  Regular dental and eye exams.  Immunizations.  Screening for certain conditions.  Healthy lifestyle choices, such as eating a healthy diet, getting regular exercise, not using drugs or products that contain nicotine and tobacco, and limiting alcohol use. What can I expect for my preventive care visit? Physical exam Your health care provider will check:  Height and weight. These may be used to calculate body mass index (BMI), which is a measurement that tells if you are at a healthy weight.  Heart rate and blood pressure.  Your skin for abnormal spots. Counseling Your health care provider may ask you questions about:  Alcohol, tobacco, and drug use.  Emotional well-being.  Home and relationship well-being.  Sexual activity.  Eating habits.  Work and work Statistician. What immunizations do I need?  Influenza (flu) vaccine  This is recommended every year. Tetanus, diphtheria, and pertussis (Tdap) vaccine  You may need a Td booster every 10 years. Varicella (chickenpox) vaccine  You may need this vaccine if you have not already been  vaccinated. Zoster (shingles) vaccine  You may need this after age 41. Measles, mumps, and rubella (MMR) vaccine  You may need at least one dose of MMR if you were born in 1957 or later. You may also need a second dose. Pneumococcal conjugate (PCV13) vaccine  You may need this if you have certain conditions and were not previously vaccinated. Pneumococcal polysaccharide (PPSV23) vaccine  You may need one or two doses if you smoke cigarettes or if you have certain conditions. Meningococcal conjugate (MenACWY) vaccine  You may need this if you have certain conditions. Hepatitis A vaccine  You may need this if you have certain conditions or if you travel or work in places where you may be exposed to hepatitis A. Hepatitis B vaccine  You may need this if you have certain conditions or if you travel or work in places where you may be exposed to hepatitis B. Haemophilus influenzae type b (Hib) vaccine  You may need this if you have certain risk factors. Human papillomavirus (HPV) vaccine  If recommended by your health care provider, you may need three doses over 6 months. You may receive vaccines as individual doses or as more than one vaccine together in one shot (combination vaccines). Talk with your health care provider about the risks and benefits of combination vaccines. What tests do I need? Blood tests  Lipid and cholesterol levels. These may be checked every 5 years, or more frequently if you are over 20 years old.  Hepatitis C test.  Hepatitis B test. Screening  Lung cancer screening. You may have this screening every year starting at  age 86 if you have a 30-pack-year history of smoking and currently smoke or have quit within the past 15 years.  Prostate cancer screening. Recommendations will vary depending on your family history and other risks.  Colorectal cancer screening. All adults should have this screening starting at age 30 and continuing until age 28. Your  health care provider may recommend screening at age 60 if you are at increased risk. You will have tests every 1-10 years, depending on your results and the type of screening test.  Diabetes screening. This is done by checking your blood sugar (glucose) after you have not eaten for a while (fasting). You may have this done every 1-3 years.  Sexually transmitted disease (STD) testing. Follow these instructions at home: Eating and drinking  Eat a diet that includes fresh fruits and vegetables, whole grains, lean protein, and low-fat dairy products.  Take vitamin and mineral supplements as recommended by your health care provider.  Do not drink alcohol if your health care provider tells you not to drink.  If you drink alcohol: ? Limit how much you have to 0-2 drinks a day. ? Be aware of how much alcohol is in your drink. In the [place] one drink equals one 12 oz bottle of beer (355 mL), one 5 oz glass of wine (148 mL), or one 1 oz glass of hard liquor (44 mL). Lifestyle  Take daily care of your teeth and gums.  Stay active. Exercise for at least 30 minutes on 5 or more days each week.  Do not use any products that contain nicotine or tobacco, such as cigarettes, e-cigarettes, and chewing tobacco. If you need help quitting, ask your health care provider.  If you are sexually active, practice safe sex. Use a condom or other form of protection to prevent STIs (sexually transmitted infections).  Talk with your health care provider about taking a low-dose aspirin every day starting at age 86. What's next?  Go to your health care provider once a year for a well check visit.  Ask your health care provider how often you should have your eyes and teeth checked.  Stay up to date on all vaccines. This information is not intended to replace advice given to you by your health care provider. Make sure you discuss any questions you have with your health care provider. Document Revised: 12/13/2017  Document Reviewed: 12/13/2017 Elsevier Patient Education  2020 [employer].

## 2019-08-20 LAB — COMPLETE METABOLIC PANEL WITH GFR
AG Ratio: 1.7 (calc) (ref 1.0–2.5)
ALT: 22 U/L (ref 9–46)
AST: 14 U/L (ref 10–40)
Albumin: 4.2 g/dL (ref 3.6–5.1)
Alkaline phosphatase (APISO): 65 U/L (ref 36–130)
BUN: 16 mg/dL (ref 7–25)
CO2: 25 mmol/L (ref 20–32)
Calcium: 9.5 mg/dL (ref 8.6–10.3)
Chloride: 106 mmol/L (ref 98–110)
Creat: 0.9 mg/dL (ref 0.60–1.35)
GFR, Est African American: 116 mL/min/{1.73_m2} (ref >=60)
GFR, Est Non African American: 100 mL/min/{1.73_m2} (ref >=60)
Globulin: 2.5 g/dL (calc) (ref 1.9–3.7)
Glucose, Bld: 156 mg/dL — ABNORMAL HIGH (ref 65–99)
Potassium: 4.8 mmol/L (ref 3.5–5.3)
Sodium: 136 mmol/L (ref 135–146)
Total Bilirubin: 0.3 mg/dL (ref 0.2–1.2)
Total Protein: 6.7 g/dL (ref 6.1–8.1)

## 2019-08-20 LAB — HEMOGLOBIN A1C
Hgb A1c MFr Bld: 7.3 % of total Hgb — ABNORMAL HIGH (ref <5.7)
Mean Plasma Glucose: 163 (calc)
eAG (mmol/L): 9 (calc)

## 2019-08-20 LAB — HEPATITIS C ANTIBODY
Hepatitis C Ab: NONREACTIVE
SIGNAL TO CUT-OFF: 0.15 (ref <1.00)

## 2019-08-20 LAB — MICROALBUMIN, URINE: Microalb, Ur: 1.5 mg/dL

## 2019-11-17 NOTE — Progress Notes (Signed)
Patient is a 49 y.o.male patient of Danelle Berry Last visit with her was an annual exam 08/19/19 Follows up today via phone visit with fever  Has a h/o DM, hyperlipidemia, obesity, tob dep   + cough, no production No marked SOB + fever, feeling feverish + sore throat.  +mild congestion + loss of smell, loss of taste + N/V + muscle aches No marked loose stools/diarrhea No CP, confusion, passing out episodes  + tob use  Comorbid conditions reviewed + h/o DM,  obesity    Name: Sonya Pucci   MRN: [id number]    DOB: [birth year]   Date:11/18/2019       Progress Note  Subjective  Chief Complaint  Chief Complaint  Patient presents with  . Nasal Congestion  . Cough    I connected with  Clementeen Graham Rojero on 11/18/19 at  8:00 AM EST by telephone and verified that I am speaking with the correct person using two identifiers.  I discussed the limitations, risks, security and privacy concerns of performing an evaluation and management service by telephone and the availability of in person appointments. The patient expressed understanding and agreed to proceed. Staff also discussed with the patient that there may be a patient responsible charge related to this service. Patient Location: Home Provider Location: [facility] Additional Individuals present: none  HPI Patient is a 49 y.o.male patient of Danelle Berry Last visit with her was an annual exam 08/19/19 Follows up today via phone visit with above complaints  Has a h/o DM, hyperlipidemia, obesity, tob dep  Symptoms started about 5 days,  + cough, not as much as prior,  + production - sort of yellow, no blood No marked SOB + fever when first started - 102 highest, not in last couple days, not feeling feverish No sore throat.  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  He noted he is feeling well enough to return, and wanted to make sure that was reasonable  Patient Active Problem List   Diagnosis Date Noted  . Tobacco abuse 04/05/2018  . Obstructive sleep apnea 07/20/2017  . Vitamin D deficiency 03/15/2017  . Type 2 diabetes mellitus (HCC) 04/28/2016  . Class 3 severe obesity without serious comorbidity with body mass index (BMI) of 40.0 to 44.9 in adult [facility]) 09/28/2014    Past Surgical History:  Procedure Laterality Date  . CARPAL TUNNEL RELEASE    . CARPAL TUNNEL RELEASE Right    approximately 10 years ago    Family History  Problem Relation Age of Onset  . Hypertension Mother   . Sleep disorder Father   . Hypertension Brother     Social History   Tobacco Use  . Smoking status: Current Every Day Smoker    Packs/day: 0.50    Years: 24.00    Pack years: 12.00    Types: Cigarettes  .  Smokeless tobacco: Never Used  . Tobacco comment: most of years less than half a pack per day   Substance Use Topics  . Alcohol use: Yes    Alcohol/week: 0.0 standard drinks    Comment: occasionally     Current Outpatient Medications:  .  aspirin EC 81 MG tablet, Take 1 tablet (81 mg total) by mouth daily., Disp: 90 tablet, Rfl: 0 .  metFORMIN (GLUCOPHAGE) 500 MG tablet, Take 1 tablet (500 mg total) by mouth 2 (two) times daily with a meal., Disp: 180 tablet, Rfl: 3  No Known Allergies  With staff assistance, above reviewed with the patient today.  ROS: As per HPI, otherwise no specific complaints on a limited and focused  system review   Objective  Virtual encounter, vitals not obtained.  There is no height or weight on file to calculate BMI.  Physical Exam   Appears in NAD via conversation, 1 mild cough during our conversation. Reading: No obvious respiratory distress. Speaking in complete sentences Neurological: Pt is alert and oriented, Speech is normal Psychiatric: Patient has a normal mood and affect, behavior is normal. Judgment and thought content normal.   No results found for this or any previous visit (from the past 72 hour(s)).  PHQ2/9: Depression screen [facility] 2/9 11/18/2019 08/19/2019 05/20/2019 02/18/2019 08/14/2018  Decreased Interest 0 0 0 0 0  Down, Depressed, Hopeless 0 0 0 0 0  PHQ - 2 Score 0 0 0 0 0  Altered sleeping - 0 0 0 0  Tired, decreased energy - 0 0 0 0  Change in appetite - 0 0 0 0  Feeling bad or failure about yourself  - 0 0 0 0  Trouble concentrating - 0 0 0 0  Moving slowly or fidgety/restless - 0 0 0 0  Suicidal thoughts - 0 0 0 0  PHQ-9 Score - 0 0 0 0  Difficult doing work/chores - Not difficult at all Not difficult at all Not difficult at all Not difficult at all  Some recent data might be hidden   PHQ-2/9 Result reviewed  Fall Risk: Fall Risk  11/18/2019 08/19/2019 05/20/2019 02/18/2019 08/14/2018  Falls in the past year? 0 0 0 0 0  Number falls in past yr: 0 0 0 0 0  Injury with Fall? 0 0 0 0 0     Assessment & Plan 1. Viral upper respiratory tract infection Discussed with patient that this is likely a upper respiratory infection, with most of these being viral and feel his is likely viral.  He is improving since his symptoms started.  Has been treating it appropriately with symptomatic measures. Given he works in [employer], felt best to have him get a Covid test today, to help ensure it is not Covid, as discussed that is in the differential.  He can get one done at a [facility] or [facility], and get the result back same day.  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  He does wear a mask at work.  If the Covid test is positive, he should not return to work, and let us know, as did briefly discuss recommendations to return to work if is Covid, which is at least 10 days after symptoms have started, and symptoms being pretty much resolved before returning. He did have the Covid vaccine. I do feel it is less likely a Covid illness, but did feel the  above was appropriate and he agreed. Can continue the symptomatic measures he is doing presently, staying well-hydrated, and emphasized importance of following up if symptoms again worsening.  2. Tobacco abuse Is trying to lessen his tobacco use presently  3. Type 2 diabetes mellitus without complication, without C[place]-term current use of insulin (HCC) 4. Morbid obesity (HCC) Noted the comorbidities, also has a sleep apnea history.  I discussed the assessment and treatment plan with the patient. The patient was provided an opportunity to ask questions and all were answered. The patient agreed with the plan and demonstrated an understanding of the instructions.  The patient was advised to call back or seek an in-person evaluation if the symptoms worsen or if the condition fails to improve as anticipated.  I provided 15 minutes of non-face-to-face time during this encounter that included discussing at length patient's sx/history, pertinent pmhx, medications, treatment and follow up plan. This time also included the necessary documentation, orders, and chart review.  Jamelle Haring, MD

## 2019-11-18 ENCOUNTER — Encounter: Payer: Self-pay | Admitting: Internal Medicine

## 2019-11-18 ENCOUNTER — Telehealth (INDEPENDENT_AMBULATORY_CARE_PROVIDER_SITE_OTHER): Payer: BC Managed Care – PPO | Admitting: Internal Medicine

## 2019-11-18 DIAGNOSIS — J069 Acute upper respiratory infection, unspecified: Secondary | ICD-10-CM | POA: Diagnosis not present

## 2019-11-18 DIAGNOSIS — Z720 Tobacco use: Secondary | ICD-10-CM

## 2019-11-18 DIAGNOSIS — E119 Type 2 diabetes mellitus without complications: Secondary | ICD-10-CM | POA: Diagnosis not present

## 2020-02-24 ENCOUNTER — Other Ambulatory Visit: Payer: Self-pay

## 2020-02-24 ENCOUNTER — Ambulatory Visit: Payer: BC Managed Care – PPO | Admitting: Family Medicine

## 2020-02-24 ENCOUNTER — Encounter: Payer: Self-pay | Admitting: Family Medicine

## 2020-02-24 VITALS — BP 124/78 | HR 97 | Temp 98.1°F | Resp 18 | Ht 66.0 in | Wt 238.7 lb

## 2020-02-24 DIAGNOSIS — Z6841 Body Mass Index (BMI) 40.0 and over, adult: Secondary | ICD-10-CM

## 2020-02-24 DIAGNOSIS — E559 Vitamin D deficiency, unspecified: Secondary | ICD-10-CM

## 2020-02-24 DIAGNOSIS — Z114 Encounter for screening for human immunodeficiency virus [HIV]: Secondary | ICD-10-CM

## 2020-02-24 DIAGNOSIS — Z5181 Encounter for therapeutic drug level monitoring: Secondary | ICD-10-CM

## 2020-02-24 DIAGNOSIS — Z720 Tobacco use: Secondary | ICD-10-CM | POA: Diagnosis not present

## 2020-02-24 DIAGNOSIS — E119 Type 2 diabetes mellitus without complications: Secondary | ICD-10-CM | POA: Diagnosis not present

## 2020-02-24 NOTE — Progress Notes (Signed)
Name: Alvin Green   MRN: [id number]    DOB: [birth year]   Date:02/24/2020       Progress Note  Chief Complaint  Patient presents with  . Follow-up    6 months  . Diabetes     Subjective:   Alvin Green is a 50 y.o. male, presents to clinic for f/up on DM, he has stopped meds, not checking his sugars  DM:   Hx of being uncontrolled, stopped metformin Denies: Polyuria, polydipsia, vision changes, neuropathy, hypoglycemia Recent pertinent labs:  Lab Results  Component Value Date        HGBA1C 7.3 (H) 08/19/2019   HGBA1C 7.8 (H) 05/20/2019   HGBA1C 7.3 (A) 05/20/2019   HGBA1C 8.4 (H) 02/18/2019   HGBA1C 8.2 (A) 02/18/2019   Standard of care and health maintenance: Urine Microalbumin: done August 2021 Foot exam:  due DM eye exam:  due ACEI/ARB:  Not taking Statin:  Not taking     Current Outpatient Medications:  .  aspirin EC 81 MG tablet, Take 1 tablet (81 mg total) by mouth daily., Disp: 90 tablet, Rfl: 0 .  metFORMIN (GLUCOPHAGE) 500 MG tablet, Take 1 tablet (500 mg total) by mouth 2 (two) times daily with a meal. (Patient not taking: Reported on 02/24/2020), Disp: 180 tablet, Rfl: 3  Patient Active Problem List   Diagnosis Date Noted  . Tobacco abuse 04/05/2018  . Obstructive sleep apnea 07/20/2017  . Vitamin D deficiency 03/15/2017  . Type 2 diabetes mellitus (HCC) 04/28/2016  . Class 3 severe obesity without serious comorbidity with body mass index (BMI) of 40.0 to 44.9 in adult [facility]) 09/28/2014    Past Surgical History:  Procedure Laterality Date  . CARPAL TUNNEL RELEASE    . CARPAL TUNNEL RELEASE Right    approximately 10 years ago    Family History  Problem Relation Age of Onset  . Hypertension Mother   . Sleep disorder Father   . Hypertension Brother     Social History   Tobacco Use  . Smoking status: Current Every Day Smoker    Packs/day: 0.50    Years: 24.00    Pack years: 12.00    Types: Cigarettes  . Smokeless  tobacco: Never Used  . Tobacco comment: most of years less than half a pack per day   Vaping Use  . Vaping Use: Never used  Substance Use Topics  . Alcohol use: Yes    Alcohol/week: 0.0 standard drinks    Comment: occasionally  . Drug use: No     No Known Allergies  Health Maintenance  Topic Date Due  . OPHTHALMOLOGY EXAM  06/13/2017  . COVID-19 Vaccine (3 - Booster for Pfizer series) 12/18/2019  . FOOT EXAM  02/18/2020  . HEMOGLOBIN A1C  02/19/2020  . INFLUENZA VACCINE  04/01/2020 (Originally 08/03/2019)  . COLONOSCOPY (Pts 45-6yrs Insurance coverage will need to be confirmed)  02/23/2021 (Originally 06/04/2015)  . PNEUMOCOCCAL POLYSACCHARIDE VACCINE AGE 27-64 HIGH RISK  02/23/2021 (Originally 06/03/1972)  . TETANUS/TDAP  02/23/2021 (Originally 06/03/1989)  . URINE MICROALBUMIN  08/18/2020  . Hepatitis C Screening  Completed  . HIV Screening  Completed    Chart Review Today: I personally reviewed active problem list, medication list, allergies, family history, social history, health maintenance, notes from last encounter, lab results, imaging with the patient/caregiver today.   Review of Systems  All other ROS reviewed and are negative for acute change except as noted in the HPI.  Objective:  Vitals:   02/24/20 0830  BP: 124/78  Pulse: 97  Resp: 18  Temp: 98.1 F (36.7 C)  SpO2: 99%  Weight: 238 lb 11.2 oz (108.3 kg)  Height: 5\' 6"  (1.676 m)    Body mass index is 38.53 kg/m.  Physical Exam Vitals and nursing note reviewed.  Constitutional:      General: He is not in acute distress.    Appearance: Normal appearance. He is well-developed. He is obese. He is not ill-appearing, toxic-appearing or diaphoretic.     Interventions: Face mask in place.  HENT:     Head: Normocephalic and atraumatic.     Jaw: No trismus.     Right Ear: External ear normal.     Left Ear: External ear normal.  Eyes:     General: Lids are normal. No scleral icterus.       Right eye: No  discharge.        Left eye: No discharge.     Conjunctiva/sclera: Conjunctivae normal.  Neck:     Trachea: Trachea and phonation normal. No tracheal deviation.  Cardiovascular:     Rate and Rhythm: Normal rate and regular rhythm.     Pulses: Normal pulses.          Radial pulses are 2+ on the right side and 2+ on the left side.       Posterior tibial pulses are 2+ on the right side and 2+ on the left side.     Heart sounds: Normal heart sounds. No murmur heard. No friction rub. No gallop.   Pulmonary:     Effort: Pulmonary effort is normal. No respiratory distress.     Breath sounds: Normal breath sounds. No stridor. No wheezing, rhonchi or rales.  Abdominal:     General: Bowel sounds are normal. There is no distension.     Palpations: Abdomen is soft.  Musculoskeletal:     Right lower leg: No edema.     Left lower leg: No edema.  Skin:    General: Skin is warm and dry.     Coloration: Skin is not jaundiced.     Findings: No rash.     Nails: There is no clubbing.  Neurological:     Mental Status: He is alert. Mental status is at baseline.     Cranial Nerves: No dysarthria or facial asymmetry.     Motor: No tremor or abnormal muscle tone.     Gait: Gait normal.  Psychiatric:        Mood and Affect: Mood normal.        Speech: Speech normal.        Behavior: Behavior normal. Behavior is cooperative.         Assessment & Plan:   1. Type 2 diabetes mellitus without complication, without Pergola-term current use of insulin (HCC) Uncontrolled - pt stopped meds Due for labs - will need to restart meds if a1c > 7.0 Advised standard of care - foot exam, eye exam, statin, ACEI/ARB - info and handouts given to pt today - Hemoglobin A1C - COMPLETE METABOLIC PANEL WITH GFR  2. Class 3 severe obesity without serious comorbidity with body mass index (BMI) of 40.0 to 44.9 in adult, unspecified obesity type (HCC) - Hemoglobin A1C - COMPLETE METABOLIC PANEL WITH GFR - Lipid  panel  3. Screening for HIV without presence of risk factors - HIV antibody (with reflex)  4. Tobacco abuse Smoking cessation instruction/counseling given:  counseled patient on the dangers  of tobacco use, advised patient to stop smoking, and reviewed strategies to maximize success   5. Vitamin D deficiency - COMPLETE METABOLIC PANEL WITH GFR - VITAMIN D 25 Hydroxy (Vit-D Deficiency, Fractures)  6. Encounter for medication monitoring - Hemoglobin A1C - CBC with Differential/Platelet - COMPLETE METABOLIC PANEL WITH GFR - Lipid panel - VITAMIN D 25 Hydroxy (Vit-D Deficiency, Fractures)   Return in about 3 months (around 05/23/2020) for Routine follow-up.   Danelle Berry, PA-C 02/24/20 8:40 AM

## 2020-02-25 LAB — LIPID PANEL
Cholesterol: 139 mg/dL (ref <200)
HDL: 57 mg/dL (ref >=40)
LDL Cholesterol (Calc): 68 mg/dL (calc)
Non-HDL Cholesterol (Calc): 82 mg/dL (calc) (ref <130)
Total CHOL/HDL Ratio: 2.4 (calc) (ref <5.0)
Triglycerides: 56 mg/dL (ref <150)

## 2020-02-25 LAB — COMPLETE METABOLIC PANEL WITH GFR
AG Ratio: 1.8 (calc) (ref 1.0–2.5)
ALT: 32 U/L (ref 9–46)
AST: 26 U/L (ref 10–40)
Albumin: 4.1 g/dL (ref 3.6–5.1)
Alkaline phosphatase (APISO): 77 U/L (ref 36–130)
BUN: 15 mg/dL (ref 7–25)
CO2: 28 mmol/L (ref 20–32)
Calcium: 9.6 mg/dL (ref 8.6–10.3)
Chloride: 107 mmol/L (ref 98–110)
Creat: 0.74 mg/dL (ref 0.60–1.35)
GFR, Est African American: 126 mL/min/{1.73_m2} (ref >=60)
GFR, Est Non African American: 108 mL/min/{1.73_m2} (ref >=60)
Globulin: 2.3 g/dL (calc) (ref 1.9–3.7)
Glucose, Bld: 244 mg/dL — ABNORMAL HIGH (ref 65–99)
Potassium: 5.2 mmol/L (ref 3.5–5.3)
Sodium: 140 mmol/L (ref 135–146)
Total Bilirubin: 0.5 mg/dL (ref 0.2–1.2)
Total Protein: 6.4 g/dL (ref 6.1–8.1)

## 2020-02-25 LAB — CBC WITH DIFFERENTIAL/PLATELET
Absolute Monocytes: 676 cells/uL (ref 200–950)
Basophils Absolute: 48 cells/uL (ref 0–200)
Basophils Relative: 0.7 %
Eosinophils Absolute: 262 cells/uL (ref 15–500)
Eosinophils Relative: 3.8 %
HCT: 41.5 % (ref 38.5–50.0)
Hemoglobin: 13.5 g/dL (ref 13.2–17.1)
Lymphs Abs: 1835 cells/uL (ref 850–3900)
MCH: 29.2 pg (ref 27.0–33.0)
MCHC: 32.5 g/dL (ref 32.0–36.0)
MCV: 89.6 fL (ref 80.0–100.0)
MPV: 12.7 fL — ABNORMAL HIGH (ref 7.5–12.5)
Monocytes Relative: 9.8 %
Neutro Abs: 4078 cells/uL (ref 1500–7800)
Neutrophils Relative %: 59.1 %
Platelets: 174 10*3/uL (ref 140–400)
RBC: 4.63 10*6/uL (ref 4.20–5.80)
RDW: 10.7 % — ABNORMAL LOW (ref 11.0–15.0)
Total Lymphocyte: 26.6 %
WBC: 6.9 10*3/uL (ref 3.8–10.8)

## 2020-02-25 LAB — VITAMIN D 25 HYDROXY (VIT D DEFICIENCY, FRACTURES): Vit D, 25-Hydroxy: 15 ng/mL — ABNORMAL LOW (ref 30–100)

## 2020-02-25 LAB — HEMOGLOBIN A1C
Hgb A1c MFr Bld: 10.7 % of total Hgb — ABNORMAL HIGH (ref <5.7)
Mean Plasma Glucose: 260 mg/dL
eAG (mmol/L): 14.4 mmol/L

## 2020-02-25 LAB — HIV ANTIBODY (ROUTINE TESTING W REFLEX): HIV 1&2 Ab, 4th Generation: NONREACTIVE

## 2020-03-08 ENCOUNTER — Telehealth: Payer: Self-pay

## 2020-03-08 NOTE — Telephone Encounter (Signed)
-----   Message from Alba Cory, MD sent at 03/07/2020  8:12 PM EST ----- I need to see him, diabetes out of control

## 2020-03-08 NOTE — Telephone Encounter (Signed)
lvm for pt to return call to schedule appt.

## 2020-03-16 ENCOUNTER — Encounter: Payer: Self-pay | Admitting: Family Medicine

## 2020-04-06 ENCOUNTER — Ambulatory Visit: Payer: BC Managed Care – PPO | Admitting: Family Medicine

## 2020-05-24 ENCOUNTER — Ambulatory Visit: Payer: BC Managed Care – PPO | Admitting: Family Medicine

## 2020-05-26 ENCOUNTER — Ambulatory Visit: Payer: BC Managed Care – PPO | Admitting: Family Medicine

## 2020-05-26 DIAGNOSIS — E119 Type 2 diabetes mellitus without complications: Secondary | ICD-10-CM

## 2020-05-26 DIAGNOSIS — Z5181 Encounter for therapeutic drug level monitoring: Secondary | ICD-10-CM

## 2020-06-08 ENCOUNTER — Encounter: Payer: Self-pay | Admitting: Unknown Physician Specialty

## 2020-06-08 ENCOUNTER — Other Ambulatory Visit: Payer: Self-pay

## 2020-06-08 ENCOUNTER — Ambulatory Visit: Payer: BC Managed Care – PPO | Admitting: Unknown Physician Specialty

## 2020-06-08 VITALS — BP 124/78 | HR 94 | Temp 99.6°F | Resp 18 | Ht 66.0 in | Wt 233.2 lb

## 2020-06-08 DIAGNOSIS — E119 Type 2 diabetes mellitus without complications: Secondary | ICD-10-CM | POA: Diagnosis not present

## 2020-06-08 LAB — POCT GLYCOSYLATED HEMOGLOBIN (HGB A1C): Hemoglobin A1C: 10.1 % — AB (ref 4.0–5.6)

## 2020-06-08 MED ORDER — METFORMIN HCL 500 MG PO TABS: 500.0000 mg | ORAL_TABLET | Freq: Two times a day (BID) | ORAL | 3 refills | Status: DC

## 2020-06-08 NOTE — Progress Notes (Signed)
BP 124/78   Pulse 94   Temp 99.6 F (37.6 C)   Resp 18   Ht 5\' 6"  (1.676 m)   Wt 233 lb 3.2 oz (105.8 kg)   SpO2 98%   BMI 37.64 kg/m    Subjective:    Patient ID: [name], male    DOB: 11/04/1970, 50 y.o.   MRN: [id number]  HPI: Alvin Green is a 50 y.o. male  Chief Complaint  Patient presents with  . Follow-up  . Diabetes   Diabetes: Pt states that he was previously diet controlled and not taking Metformin as he is trying to get back to that point.  Metformin bothers his stomach and feels he doesn't want to use the bathroom   No hypoglycemic episodes No hyperglycemic episodes Feet problems: none Blood Sugars averaging:Checks about once every 3 weeks though says he needs a new meter.  Can't tell me the numbers eye exam within last year:   Last Hgb A1C: 10.1 this AM  Hypertension  Using medications without difficulty Average home BPs    Using medication without problems or lightheadedness No chest pain with exertion or shortness of breath No Edema  Elevated Cholesterol Using medications without problems No Muscle aches  Diet: Doing well Exercise: Works 12 hour shifts.    The 10-year ASCVD risk score 44 Alvin Green., et al., 2013) is: 13.6%   Values used to calculate the score:     Age: 21 years     Sex: Male     Is Non-Hispanic African American: Yes     Diabetic: Yes     Tobacco smoker: Yes     Systolic Blood Pressure: 124 mmHg     Is BP treated: No     HDL Cholesterol: 57 mg/dL     Total Cholesterol: 139 mg/dL    Relevant past medical, surgical, family and social history reviewed and updated as indicated. Interim medical history since our last visit reviewed. Allergies and medications reviewed and updated.  Review of Systems  Per HPI unless specifically indicated above     Objective:    BP 124/78   Pulse 94   Temp 99.6 F (37.6 C)   Resp 18   Ht 5\' 6"  (1.676 m)   Wt 233 lb 3.2 oz (105.8 kg)   SpO2 98%   BMI 37.64  kg/m   Wt Readings from Last 3 Encounters:  06/08/20 233 lb 3.2 oz (105.8 kg)  02/24/20 238 lb 11.2 oz (108.3 kg)  08/19/19 250 lb 1.6 oz (113.4 kg)    Physical Exam Constitutional:      General: He is not in acute distress.    Appearance: Normal appearance. He is well-developed.  HENT:     Head: Normocephalic and atraumatic.  Eyes:     General: Lids are normal. No scleral icterus.       Right eye: No discharge.        Left eye: No discharge.     Conjunctiva/sclera: Conjunctivae normal.  Neck:     Vascular: No carotid bruit or JVD.  Cardiovascular:     Rate and Rhythm: Normal rate and regular rhythm.     Heart sounds: Normal heart sounds.  Pulmonary:     Effort: Pulmonary effort is normal. No respiratory distress.     Breath sounds: Normal breath sounds.  Abdominal:     Palpations: There is no hepatomegaly or splenomegaly.  Musculoskeletal:        General: Normal range  of motion.     Cervical back: Normal range of motion and neck supple.  Skin:    General: Skin is warm and dry.     Coloration: Skin is not pale.     Findings: No rash.  Neurological:     Mental Status: He is alert and oriented to person, place, and time.  Psychiatric:        Behavior: Behavior normal.        Thought Content: Thought content normal.        Judgment: Judgment normal.     Results for orders placed or performed in visit on 06/08/20  POCT HgB A1C  Result Value Ref Range   Hemoglobin A1C 10.1 (A) 4.0 - 5.6 %   HbA1c POC (<> result, manual entry)     HbA1c, POC (prediabetic range)     HbA1c, POC (controlled diabetic range)        Assessment & Plan:   Problem List Items Addressed This Visit      Unprioritized   Type 2 diabetes mellitus (HCC) - Primary (Chronic)    Hgb A1C is 10.1.  Reluctantly agrees to Metformin.  Will try Alvin Green for improved GI effects.  Recheck in 3 months.  Microalbumin not elevated last August.  Rcheck today.  Refusing statin but ASCVD risk discussed       Relevant Medications   metFORMIN (GLUCOPHAGE) 500 MG tablet   Other Relevant Orders   POCT HgB A1C (Completed)   Microalbumin, urine       Follow up plan: Return in about 3 months (around 09/08/2020).

## 2020-06-08 NOTE — Assessment & Plan Note (Signed)
Hgb A1C is 10.1.  Reluctantly agrees to Metformin.  Will try Saravia-acting for improved GI effects.  Recheck in 3 months.  Microalbumin not elevated last August.  Rcheck today.  Refusing statin but ASCVD risk discussed

## 2020-06-09 LAB — MICROALBUMIN, URINE: Microalb, Ur: 2.4 mg/dL

## 2020-08-24 ENCOUNTER — Ambulatory Visit: Payer: BC Managed Care – PPO | Admitting: Family Medicine

## 2020-09-08 ENCOUNTER — Ambulatory Visit: Payer: BC Managed Care – PPO | Admitting: Family Medicine

## 2020-09-14 ENCOUNTER — Ambulatory Visit: Payer: BC Managed Care – PPO | Admitting: Family Medicine

## 2020-09-20 ENCOUNTER — Ambulatory Visit (INDEPENDENT_AMBULATORY_CARE_PROVIDER_SITE_OTHER): Payer: BC Managed Care – PPO | Admitting: Family Medicine

## 2020-09-20 ENCOUNTER — Encounter: Payer: Self-pay | Admitting: Family Medicine

## 2020-09-20 ENCOUNTER — Other Ambulatory Visit: Payer: Self-pay

## 2020-09-20 VITALS — BP 132/72 | HR 89 | Temp 98.6°F | Resp 16 | Ht 66.0 in | Wt 236.0 lb

## 2020-09-20 DIAGNOSIS — B351 Tinea unguium: Secondary | ICD-10-CM | POA: Diagnosis not present

## 2020-09-20 DIAGNOSIS — E119 Type 2 diabetes mellitus without complications: Secondary | ICD-10-CM

## 2020-09-20 DIAGNOSIS — Z720 Tobacco use: Secondary | ICD-10-CM | POA: Diagnosis not present

## 2020-09-20 LAB — POCT GLYCOSYLATED HEMOGLOBIN (HGB A1C): Hemoglobin A1C: 8.8 % — AB (ref 4.0–5.6)

## 2020-09-20 MED ORDER — TERBINAFINE HCL 250 MG PO TABS: 250.0000 mg | ORAL_TABLET | Freq: Every day | ORAL | 0 refills | Status: DC

## 2020-09-20 NOTE — Assessment & Plan Note (Addendum)
Visualized on exam. Rx terbinafine x12 weeks. LFTs wnl.

## 2020-09-20 NOTE — Patient Instructions (Addendum)
It was great to see you!  Our plans for today:  - Let us know if you don't hear about an appointment with Podiatry. - Take terbinafine for your toenails. - Keep working on diet and exercise.  - Come back in 3 months.  We are checking some labs today, we will release these results to your MyChart.  Take care and seek immediate care sooner if you develop any concerns.   Dr. Ky Barban   Diet Recommendations for Diabetes   1. Eat at least 3 meals and 1-2 snacks per day. Never go more than 4-5 hours while awake without eating. Eat breakfast within the first hour of getting up.   2. Limit starchy foods to TWO per meal and ONE per snack. ONE portion of a starchy  food is equal to the following:   - ONE slice of bread (or its equivalent, such as half of a hamburger bun).   - 1/2 cup of a "scoopable" starchy food such as potatoes or rice.   - 15 grams of Total Carbohydrate as shown on food label.  3. Include at every meal: a protein food, a carb food, and vegetables and/or fruit.   - Obtain twice the volume of vegetables as protein or carbohydrate foods for both lunch and dinner.   - Fresh or frozen vegetables are best.   - Keep frozen vegetables on hand for a quick vegetable serving.       Starchy (carb) foods: Bread, rice, pasta, potatoes, corn, cereal, grits, crackers, bagels, muffins, all baked goods.  (Fruits, milk, and yogurt also have carbohydrate, but most of these foods will not spike your blood sugar as most starchy foods will.)  A few fruits do cause high blood sugars; use small portions of bananas (limit to 1/2 at a time), grapes, watermelon, oranges, and most tropical fruits.    Protein foods: Meat, fish, poultry, eggs, dairy foods, and beans such as pinto and kidney beans (beans also provide carbohydrate).      Look for opportunities to move your body throughout your day:  Never lie down when you can sit; never sit when you can stand; never stand when you can pace.  Moving your  body throughout the day is just as important as the 30 or 60 minutes of exercise at the gym!  Get social Get active with your friends instead of going out to eat. Go for a hike, walk around the mall, or play an exercise-themed video game.   Move more at work Fit more activity into the workday. Stand during phone calls, use a printer farther from your desk, and get up to stretch each hour.    Do something new Develop a new skill to kick-start your motivation. Sign up for a class to learn how to [employer], surf, do tai chi, or play a sport.    Keep cool in the pool Don't like to sweat? Hit the local community pool for a swim, water polo, or water aerobics class to stay cool while exercising.    Stay on track Use a fitness tracker (FITBIT, Fitness Pal mobile app) to track your activity and provide motivation to reach your goals.

## 2020-09-20 NOTE — Assessment & Plan Note (Signed)
Cessation counseling provided today, not ready to quit.

## 2020-09-20 NOTE — Progress Notes (Signed)
   SUBJECTIVE:   CHIEF COMPLAINT / HPI:   Diabetes, Type 2 - Last A1c 10.1 06/2020 - Medications: metformin 546m BID - Compliance: hasn't taken any - Checking BG at home: yes, 119-210 - Diet: eats 2 meals per day, no snacks. No fruits or vegetables. 24 hour recall: tKuwaitbacon and eggs, coffee for breakfast (12pm), dinner at 6pm (stew beef, rice, broccoli, mac and cheese). Went to bed at 1am. YWilburn Mylarwas typical day.   - Exercise: no formal exercise - Eye exam: [facility] in May or June.  - Foot exam: due - Microalbumin: UTD - Statin: no, refused - Denies symptoms of polyuria, polydipsia, numbness extremities, foot ulcers/trauma  Tobacco use - ~0.5ppd. 20 years. Not currently interested in quitting.    OBJECTIVE:   BP 132/72   Pulse 89   Temp 98.6 F (37 C)   Resp 16   Ht _0  (1.676 m)   Wt 236 lb (107 kg)   SpO2 99%   BMI 38.09 kg/m   Gen: well appearing, in NAD Ext: WWP, no edema. Callous to ball of foot bilaterally, debridement done today. No ulcerations. Onychomycosis to b/l great big toes and R 4th toe and in between few toes b/l.  ASSESSMENT/PLAN:   Type 2 diabetes mellitus ([place] Improved with lifestyle modification, A1c 8.8 today, down from 10.1. Ok to continue with current efforts, will defer metformin for now given improvement though patient aware may have to start at next appt pending a1c. Foot exam completed today, see above. Callous debridement done today, referred to podiatry for further debridement. BMP obtained today. F/u in 3 months.  Tobacco abuse Cessation counseling provided today, not ready to quit.  Onychomycosis Visualized on exam. Rx terbinafine x12 weeks. LFTs wnl.     AMyles Gip DO

## 2020-09-20 NOTE — Assessment & Plan Note (Signed)
Improved with lifestyle modification, A1c 8.8 today, down from 10.1. Ok to continue with current efforts, will defer metformin for now given improvement though patient aware may have to start at next appt pending a1c. Foot exam completed today, see above. Callous debridement done today, referred to podiatry for further debridement. BMP obtained today. F/u in 3 months.

## 2020-09-21 LAB — BASIC METABOLIC PANEL
BUN: 22 mg/dL (ref 7–25)
CO2: 27 mmol/L (ref 20–32)
Calcium: 9.9 mg/dL (ref 8.6–10.3)
Chloride: 103 mmol/L (ref 98–110)
Creat: 0.91 mg/dL (ref 0.70–1.30)
Glucose, Bld: 248 mg/dL — ABNORMAL HIGH (ref 65–99)
Potassium: 5.1 mmol/L (ref 3.5–5.3)
Sodium: 137 mmol/L (ref 135–146)

## 2020-10-13 ENCOUNTER — Telehealth: Payer: Self-pay

## 2020-10-13 NOTE — Telephone Encounter (Signed)
Copied from CRM [id number]. Topic: Referral - Status >> Oct 12, 2020  4:53 PM Marylen Ponto wrote: Reason for CRM: Pt stated he has not been able to get in contact with the foot doctor that he was referred to. Pt stated no one ever answers and he would like to request that another referral be sent to that location or somewhere else because he needs to schedule an appt.

## 2020-10-14 NOTE — Telephone Encounter (Signed)
Spoke to pt, let him know podiatry office has reached out to him x3 already. Pt states they always reach out while he is working and cannot answer the phone.

## 2020-10-26 ENCOUNTER — Other Ambulatory Visit: Payer: Self-pay

## 2020-10-26 ENCOUNTER — Ambulatory Visit: Payer: BC Managed Care – PPO | Admitting: Podiatry

## 2020-10-26 DIAGNOSIS — M79674 Pain in right toe(s): Secondary | ICD-10-CM

## 2020-10-26 DIAGNOSIS — M79675 Pain in left toe(s): Secondary | ICD-10-CM | POA: Diagnosis not present

## 2020-10-26 DIAGNOSIS — B351 Tinea unguium: Secondary | ICD-10-CM

## 2020-10-26 DIAGNOSIS — L989 Disorder of the skin and subcutaneous tissue, unspecified: Secondary | ICD-10-CM

## 2020-11-07 NOTE — Progress Notes (Signed)
   SUBJECTIVE Patient presents to office today complaining of elongated, thickened nails that cause pain while ambulating in shoes.  Patient is unable to trim their own nails.  Patient also has symptomatic calluses.  He would like to have them debrided as well patient is here for further evaluation and treatment.  Past Medical History:  Diagnosis Date   Acquired trigger finger 08/02/2017   Decreased libido    Heart murmur    Obesity    Sleep apnea in adult    Sprain of metacarpophalangeal joint 08/02/2017   Type 2 diabetes mellitus (HCC) 04/28/2016    OBJECTIVE General Patient is awake, alert, and oriented x 3 and in no acute distress. Derm Skin is dry and supple bilateral. Negative open lesions or macerations. Remaining integument unremarkable. Nails are tender, [place], thickened and dystrophic with subungual debris, consistent with onychomycosis, 1-5 bilateral. No signs of infection noted.  Hyperkeratotic preulcerative callus lesions also noted to the bilateral feet Vasc  DP and PT pedal pulses palpable bilaterally. Temperature gradient within normal limits.  Neuro Epicritic and protective threshold sensation grossly intact bilaterally.  Musculoskeletal Exam No symptomatic pedal deformities noted bilateral. Muscular strength within normal limits.  ASSESSMENT 1.  Pain due to onychomycosis of toenails both 2.  Porokeratosis bilateral  PLAN OF CARE 1. Patient evaluated today.  2. Instructed to maintain good pedal hygiene and foot care.  3. Mechanical debridement of nails 1-5 bilaterally performed using a nail nipper. Filed with dremel without incident.  4.  Excisional debridement of the hyperkeratotic preulcerative callus lesions was performed using a 312 scalpel without incident or bleeding  5.  Continue oral Lamisil 250 mg as per PCP  6.  Return to clinic in 3 mos.    Felecia Shelling, DPM [facility]  Dr. Felecia Shelling, DPM    [address] [place], [place] [address]                Office [phone]  Fax [phone]

## 2020-12-28 ENCOUNTER — Ambulatory Visit: Payer: BC Managed Care – PPO | Admitting: Internal Medicine

## 2021-01-04 ENCOUNTER — Encounter: Payer: Self-pay | Admitting: Internal Medicine

## 2021-01-04 ENCOUNTER — Other Ambulatory Visit: Payer: Self-pay

## 2021-01-04 ENCOUNTER — Ambulatory Visit
Admission: RE | Admit: 2021-01-04 | Discharge: 2021-01-04 | Disposition: A | Discharge status: Home / Self Care | Payer: BC Managed Care – PPO | Source: Ambulatory Visit | Attending: Internal Medicine | Admitting: Internal Medicine

## 2021-01-04 ENCOUNTER — Ambulatory Visit
Admission: RE | Admit: 2021-01-04 | Discharge: 2021-01-04 | Disposition: A | Discharge status: Home / Self Care | Payer: BC Managed Care – PPO | Attending: Internal Medicine | Admitting: Internal Medicine

## 2021-01-04 ENCOUNTER — Ambulatory Visit: Payer: BC Managed Care – PPO | Admitting: Internal Medicine

## 2021-01-04 VITALS — BP 140/80 | HR 95 | Temp 98.2°F | Resp 16 | Ht 66.0 in | Wt 235.2 lb

## 2021-01-04 DIAGNOSIS — E1165 Type 2 diabetes mellitus with hyperglycemia: Secondary | ICD-10-CM

## 2021-01-04 DIAGNOSIS — G4733 Obstructive sleep apnea (adult) (pediatric): Secondary | ICD-10-CM | POA: Diagnosis not present

## 2021-01-04 DIAGNOSIS — Z23 Encounter for immunization: Secondary | ICD-10-CM

## 2021-01-04 DIAGNOSIS — Z13220 Encounter for screening for lipoid disorders: Secondary | ICD-10-CM | POA: Diagnosis not present

## 2021-01-04 DIAGNOSIS — M25561 Pain in right knee: Secondary | ICD-10-CM | POA: Insufficient documentation

## 2021-01-04 DIAGNOSIS — B351 Tinea unguium: Secondary | ICD-10-CM

## 2021-01-04 NOTE — Progress Notes (Signed)
Established Patient Office Visit  Subjective:  Patient ID: Alvin Green, male    DOB: Nov 07, 1970  Age: 51 y.o. MRN: [id number]  CC:  Chief Complaint  Patient presents with   Follow-up   Diabetes    HPI Devlin Alkire Fobes presents for follow up on chronic medical conditions.   Diabetes, Type 2: -Last A1c 9/22: 8.8 -Medications: Not on any medications currently, had been taking Metformin 500 mg once daily but stopped due to diarrhea  -Checking BG at home: Checks occasionally, random about 120 -Diet: Eating cleaner since diagnosis  -Exercise: Not currently  -Eye exam: Last summer  -Foot exam: Due today -Microalbumin: 6/22 WNL -Statin: No -PNA vaccine: Due today -Denies symptoms of hypoglycemia, polyuria, polydipsia, numbness extremities, foot ulcers/trauma.   OSA/Obesity: -CPAP nightly, working on lifestyle change  KNEE PAIN Duration: 2-3 days Involved knee: right Mechanism of injury: unknown Location:medial Onset: sudden Severity: moderate  Quality:  aching and stabbing Frequency: constant Radiation: no Aggravating factors: walking  Alleviating factors: rest  Status: better Treatments attempted: none  Relief with NSAIDs?:  No NSAIDs Taken Weakness with weight bearing or walking: no Sensation of giving way: no Locking: no Popping: yes Bruising: no Swelling: yes Redness: no Paresthesias/decreased sensation: no Fevers: no  Past Medical History:  Diagnosis Date   Acquired trigger finger 08/02/2017   Decreased libido    Heart murmur    Obesity    Sleep apnea in adult    Sprain of metacarpophalangeal joint 08/02/2017   Type 2 diabetes mellitus ([place]) 04/28/2016    Past Surgical History:  Procedure Laterality Date   CARPAL TUNNEL RELEASE     CARPAL TUNNEL RELEASE Right    approximately 10 years ago    Family History  Problem Relation Age of Onset   Hypertension Mother    Sleep disorder Father    Hypertension Brother     Social History    Socioeconomic History   Marital status: Married    Spouse name: Dewaine Oats   Number of children: 2   Years of education: Not on file   Highest education level: Some college, no degree  Occupational History   Not on file  Tobacco Use   Smoking status: Every Day    Packs/day: 0.50    Years: 24.00    Pack years: 12.00    Types: Cigarettes   Smokeless tobacco: Never   Tobacco comments:    most of years less than half a pack per day   Vaping Use   Vaping Use: Never used  Substance and Sexual Activity   Alcohol use: Yes    Alcohol/week: 0.0 standard drinks    Comment: occasionally   Drug use: No   Sexual activity: Yes    Partners: Female    Birth control/protection: None  Other Topics Concern   Not on file  Social History Narrative   Works at [employer] Strain: Not on [employer] Insecurity: Not on file  Transportation Needs: Not on file  Physical Activity: Not on file  Stress: Not on file  Social Connections: Not on file  Intimate Partner Violence: Not on file    Outpatient Medications Prior to Visit  Medication Sig Dispense Refill   aspirin EC 81 MG tablet Take 1 tablet (81 mg total) by mouth daily. 90 tablet 0   metFORMIN (GLUCOPHAGE) 500 MG tablet Take 1 tablet (500 mg total) by mouth 2 (two) times daily with  a meal. (Patient not taking: Reported on 09/20/2020) 180 tablet 3   terbinafine (LAMISIL) 250 MG tablet Take 1 tablet (250 mg total) by mouth daily. 84 tablet 0   No facility-administered medications prior to visit.    No Known Allergies  ROS Review of Systems  Constitutional:  Negative for chills and fever.  Eyes:  Negative for visual disturbance.  Respiratory:  Negative for cough.   Cardiovascular:  Negative for chest pain.  Gastrointestinal:  Negative for abdominal pain and diarrhea.  Musculoskeletal:  Positive for arthralgias.  Skin: Negative.   Neurological:  Negative for dizziness and  headaches.     Objective:    Physical Exam Constitutional:      Appearance: Normal appearance.  HENT:     Head: Normocephalic and atraumatic.  Eyes:     Conjunctiva/sclera: Conjunctivae normal.  Cardiovascular:     Rate and Rhythm: Normal rate and regular rhythm.     Pulses:          Dorsalis pedis pulses are 2+ on the right side and 2+ on the left side.  Pulmonary:     Effort: Pulmonary effort is normal.     Breath sounds: Normal breath sounds.  Musculoskeletal:        General: Tenderness present. Normal range of motion.     Right knee: Crepitus present. No swelling, deformity, effusion, erythema, ecchymosis or lacerations. Normal range of motion. Tenderness present over the medial joint line. No LCL laxity, MCL laxity, ACL laxity or PCL laxity. Normal alignment, normal meniscus and normal patellar mobility.     Instability Tests: Anterior drawer test negative. Posterior drawer test negative. Anterior Lachman test negative. Medial McMurray test negative and lateral McMurray test negative.     Right lower leg: No swelling. No edema.     Left lower leg: No edema.     Right foot: Normal range of motion. No deformity, bunion, Charcot foot, foot drop or prominent metatarsal heads.     Left foot: Normal range of motion. No deformity, bunion, Charcot foot, foot drop or prominent metatarsal heads.  Feet:     Right foot:     Protective Sensation: 8 sites tested.  8 sites sensed.     Skin integrity: Skin integrity normal.     Toenail Condition: Right toenails are abnormally thick.     Left foot:     Protective Sensation: 8 sites tested.  8 sites sensed.     Skin integrity: Skin integrity normal.     Toenail Condition: Left toenails are abnormally thick.  Skin:    General: Skin is warm and dry.  Neurological:     General: No focal deficit present.     Mental Status: He is alert. Mental status is at baseline.  Psychiatric:        Mood and Affect: Mood normal.        Behavior: Behavior  normal.    BP 140/80    Pulse 95    Temp 98.2 F (36.8 C) (Oral)    Resp 16    Ht 5\' 6"  (1.676 m)    Wt 235 lb 3.2 oz (106.7 kg)    SpO2 99%    BMI 37.96 kg/m  Wt Readings from Last 3 Encounters:  09/20/20 236 lb (107 kg)  06/08/20 233 lb 3.2 oz (105.8 kg)  02/24/20 238 lb 11.2 oz (108.3 kg)     Health Maintenance Due  Topic Date Due   COVID-19 Vaccine (3 - Booster for [employer]  series) 08/13/2019   FOOT EXAM  02/18/2020   Zoster Vaccines- Shingrix (1 of 2) Never done   INFLUENZA VACCINE  08/02/2020    There are no preventive care reminders to display for this patient.  Lab Results  Component Value Date   TSH 2.03 04/28/2016   Lab Results  Component Value Date   WBC 6.9 02/24/2020   HGB 13.5 02/24/2020   HCT 41.5 02/24/2020   MCV 89.6 02/24/2020   PLT 174 02/24/2020   Lab Results  Component Value Date   NA 137 09/20/2020   K 5.1 09/20/2020   CO2 27 09/20/2020   GLUCOSE 248 (H) 09/20/2020   BUN 22 09/20/2020   CREATININE 0.91 09/20/2020   BILITOT 0.5 02/24/2020   ALKPHOS 91 04/28/2016   AST 26 02/24/2020   ALT 32 02/24/2020   PROT 6.4 02/24/2020   ALBUMIN 4.2 04/28/2016   CALCIUM 9.9 09/20/2020   Lab Results  Component Value Date   CHOL 139 02/24/2020   Lab Results  Component Value Date   HDL 57 02/24/2020   Lab Results  Component Value Date   LDLCALC 68 02/24/2020   Lab Results  Component Value Date   TRIG 56 02/24/2020   Lab Results  Component Value Date   CHOLHDL 2.4 02/24/2020   Lab Results  Component Value Date   HGBA1C 8.8 (A) 09/20/2020      Assessment & Plan:   1. Type 2 diabetes mellitus with hyperglycemia, without Tremblay-term current use of insulin ([place]): Currently not on any medications, recheck A1c if < 7% will initiate Metformin ER. Follow up in 3 months.   - HgB A1c - COMPLETE METABOLIC PANEL WITH GFR - Lipid Profile - CBC w/Diff/Platelet  2. Lipid screening: Lipid panel today.  - Lipid Profile  3. Onychomycosis: On  Lamisil, recheck CMP today.  - COMPLETE METABOLIC PANEL WITH GFR  4. Obstructive sleep apnea: Stable, compliant with CPAP.  5. Need for prophylactic vaccination with Streptococcus pneumoniae (Pneumococcus) and Influenza vaccines: Prevnar 20 today.  - Pneumococcal conjugate vaccine 20-valent (Prevnar 20)  6. Acute pain of right knee: Physical exam reassuring but will obtain an x-ray. Discussed using anti-inflammatories as needed and Voltaren gel, weight loss.   - DG Knee Complete 4 Views Right; Future   Follow-up: Return in about 3 months (around 04/04/2021).    Teodora Medici, DO

## 2021-01-04 NOTE — Patient Instructions (Addendum)
It was great seeing you today!  Plan discussed at today's visit: -Blood work ordered today, results will be uploaded to MyChart.  -Right knee x-ray ordered, recommend anti-inflammatories as needed or Voltaren gel, supportive footwear and weight loss -Pneumonia vaccine today  Follow up in: 3 months   Take care and let us know if you have any questions or concerns prior to your next visit.  Dr. Caralee Ates

## 2021-01-05 LAB — LIPID PANEL
Cholesterol: 162 mg/dL (ref <200)
HDL: 59 mg/dL (ref >=40)
LDL Cholesterol (Calc): 85 mg/dL (calc)
Non-HDL Cholesterol (Calc): 103 mg/dL (calc) (ref <130)
Total CHOL/HDL Ratio: 2.7 (calc) (ref <5.0)
Triglycerides: 86 mg/dL (ref <150)

## 2021-01-05 LAB — HEMOGLOBIN A1C
Hgb A1c MFr Bld: 8.7 % of total Hgb — ABNORMAL HIGH (ref <5.7)
Mean Plasma Glucose: 203 mg/dL
eAG (mmol/L): 11.2 mmol/L

## 2021-01-05 LAB — CBC WITH DIFFERENTIAL/PLATELET
Absolute Monocytes: 630 cells/uL (ref 200–950)
Basophils Absolute: 53 cells/uL (ref 0–200)
Basophils Relative: 0.7 %
Eosinophils Absolute: 188 cells/uL (ref 15–500)
Eosinophils Relative: 2.5 %
HCT: 42.8 % (ref 38.5–50.0)
Hemoglobin: 13.7 g/dL (ref 13.2–17.1)
Lymphs Abs: 1995 cells/uL (ref 850–3900)
MCH: 28.7 pg (ref 27.0–33.0)
MCHC: 32 g/dL (ref 32.0–36.0)
MCV: 89.5 fL (ref 80.0–100.0)
MPV: 13 fL — ABNORMAL HIGH (ref 7.5–12.5)
Monocytes Relative: 8.4 %
Neutro Abs: 4635 cells/uL (ref 1500–7800)
Neutrophils Relative %: 61.8 %
Platelets: 168 10*3/uL (ref 140–400)
RBC: 4.78 10*6/uL (ref 4.20–5.80)
RDW: 10.7 % — ABNORMAL LOW (ref 11.0–15.0)
Total Lymphocyte: 26.6 %
WBC: 7.5 10*3/uL (ref 3.8–10.8)

## 2021-01-05 LAB — COMPLETE METABOLIC PANEL WITH GFR
AG Ratio: 1.6 (calc) (ref 1.0–2.5)
ALT: 26 U/L (ref 9–46)
AST: 15 U/L (ref 10–35)
Albumin: 4.4 g/dL (ref 3.6–5.1)
Alkaline phosphatase (APISO): 68 U/L (ref 35–144)
BUN: 13 mg/dL (ref 7–25)
CO2: 27 mmol/L (ref 20–32)
Calcium: 9.9 mg/dL (ref 8.6–10.3)
Chloride: 103 mmol/L (ref 98–110)
Creat: 0.89 mg/dL (ref 0.70–1.30)
Globulin: 2.7 g/dL (calc) (ref 1.9–3.7)
Glucose, Bld: 211 mg/dL — ABNORMAL HIGH (ref 65–99)
Potassium: 4.9 mmol/L (ref 3.5–5.3)
Sodium: 136 mmol/L (ref 135–146)
Total Bilirubin: 0.5 mg/dL (ref 0.2–1.2)
Total Protein: 7.1 g/dL (ref 6.1–8.1)
eGFR: 104 mL/min/{1.73_m2} (ref >=60)

## 2021-01-06 MED ORDER — METFORMIN HCL ER 750 MG PO TB24: 750.0000 mg | ORAL_TABLET | Freq: Every day | ORAL | 3 refills | Status: DC

## 2021-01-06 NOTE — Addendum Note (Signed)
Addended by: Margarita Mail on: 01/06/2021 10:45 AM   Modules accepted: Orders

## 2021-02-11 ENCOUNTER — Telehealth (INDEPENDENT_AMBULATORY_CARE_PROVIDER_SITE_OTHER): Payer: BC Managed Care – PPO | Admitting: Family Medicine

## 2021-02-11 ENCOUNTER — Encounter: Payer: Self-pay | Admitting: Family Medicine

## 2021-02-11 DIAGNOSIS — Z20822 Contact with and (suspected) exposure to covid-19: Secondary | ICD-10-CM | POA: Diagnosis not present

## 2021-02-11 DIAGNOSIS — R5383 Other fatigue: Secondary | ICD-10-CM | POA: Diagnosis not present

## 2021-02-11 DIAGNOSIS — R051 Acute cough: Secondary | ICD-10-CM | POA: Diagnosis not present

## 2021-02-11 NOTE — Progress Notes (Signed)
Name: Alvin Green   MRN: [id number]    DOB: September 04, 1970   Date:02/11/2021       Progress Note  Subjective  Chief Complaint  Possible COVID  I connected with  Catarina Hartshorn Hum  on 02/11/21 at  2:40 PM EST by a video enabled telemedicine application and verified that I am speaking with the correct person using two identifiers.  I discussed the limitations of evaluation and management by telemedicine and the availability of in person appointments. The patient expressed understanding and agreed to proceed with the virtual visit  Staff also discussed with the patient that there may be a patient responsible charge related to this service. Patient Location: at work  Provider Location: [facility] Additional Individuals present: alone   HPI  COVID-19: wife tested positive for COVID-19, she developed symptoms a week ago. He has noticed some nasal congestion and rhinorrhea in am's but nothing out of ordinary for him. He also has fatigue, headache, cough and chills, his symptoms started yesterday. He also has noticed some some abdominal cramping, no diarrhea. Appetite is poor , did not eat today. 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    Patient Active Problem List   Diagnosis Date Noted   Onychomycosis 09/20/2020   Tobacco abuse 04/05/2018   Obstructive sleep apnea 07/20/2017   Vitamin D deficiency 03/15/2017   Type 2 diabetes mellitus ([place]) 04/28/2016   Class 3 severe obesity without serious comorbidity with body mass index (BMI) of 40.0 to 44.9 in adult [facility]) 09/28/2014    Past Surgical History:  Procedure Laterality Date   CARPAL TUNNEL RELEASE     CARPAL TUNNEL RELEASE Right    approximately 10 years ago    Family History  Problem Relation Age of Onset   Hypertension Mother    Sleep disorder Father    Hypertension Brother       Current Outpatient Medications:    aspirin EC 81 MG tablet, Take 1 tablet (81 mg total) by mouth daily., Disp: 90 tablet,  Rfl: 0   metFORMIN (GLUCOPHAGE XR) 750 MG 24 hr tablet, Take 1 tablet (750 mg total) by mouth daily with breakfast., Disp: 90 tablet, Rfl: 3   terbinafine (LAMISIL) 250 MG tablet, Take 1 tablet (250 mg total) by mouth daily., Disp: 84 tablet, Rfl: 0  No Known Allergies  I personally reviewed active problem list, medication list, allergies, family history, social history, health maintenance with the patient/caregiver today.   ROS  Ten systems reviewed and is negative except as mentioned in HPI   Objective  Virtual encounter, vitals not obtained.  There is no height or weight on file to calculate BMI.  Physical Exam  Awake, alert and oriented   PHQ2/9: Depression screen [facility] 2/9 02/11/2021 01/04/2021 09/20/2020 06/08/2020 02/24/2020  Decreased Interest 0 0 0 0 0  Down, Depressed, Hopeless 0 0 0 0 0  PHQ - 2 Score 0 0 0 0 0  Altered sleeping 0 0 - 0 0  Tired, decreased energy 0 0 - 0 0  Change in appetite 0 0 - 0 0  Feeling bad or failure about yourself  0 0 - 0 0  Trouble concentrating 0 0 - 0 0  Moving slowly or fidgety/restless 0 0 - 0 0  Suicidal thoughts 0 0 - 0 0  PHQ-9 Score 0 0 - 0 0  Difficult doing work/chores - Not difficult at all - Not difficult at all Not difficult at all  Some recent data might be hidden   PHQ-2/9 Result is negative.    Fall Risk: Fall Risk  02/11/2021 01/04/2021 09/20/2020 06/08/2020 02/24/2020  Falls in the past year? 0 0 0 0 0  Number falls in past yr: 0 0 0 0 0  Injury with Fall? 0 0 0 0 0  Risk for fall due to : No Fall Risks No Fall Risks No Fall Risks - -  Follow up Falls prevention discussed Falls prevention discussed Falls prevention discussed - -     Assessment & Plan  1. Close exposure to COVID-19 virus  - Novel Coronavirus, NAA ([facility])  Discussed he would be a good candidate for Paxlovid, but we need to have a positive test first Discussed risk and benefits  Advised to leave work and not to return until Wednesday but to wear a  mask for 5 more days once he goes back Drink fluids, rest, vitamin C, D and zinc Avoid sleeping flat and go to [facility] if SOB   2. Acute cough  - Novel Coronavirus, NAA ([facility])  3. Fatigue, unspecified type  - Novel Coronavirus, NAA ([facility])   I discussed the assessment and treatment plan with the patient. The patient was provided an opportunity to ask questions and all were answered. The patient agreed with the plan and demonstrated an understanding of the instructions.  The patient was advised to call back or seek an in-person evaluation if the symptoms worsen or if the condition fails to improve as anticipated.  I provided 15 minutes of non-face-to-face time during this encounter.

## 2021-02-12 LAB — SPECIMEN STATUS REPORT

## 2021-02-12 LAB — NOVEL CORONAVIRUS, NAA: SARS-CoV-2, NAA: DETECTED — AB

## 2021-02-14 ENCOUNTER — Other Ambulatory Visit: Payer: Self-pay

## 2021-02-14 DIAGNOSIS — U071 COVID-19: Secondary | ICD-10-CM

## 2021-02-14 MED ORDER — NIRMATRELVIR/RITONAVIR (PAXLOVID)TABLET: 3.0000 | ORAL_TABLET | Freq: Two times a day (BID) | ORAL | 0 refills | Status: AC

## 2021-02-16 ENCOUNTER — Ambulatory Visit: Payer: BC Managed Care – PPO | Admitting: Family Medicine

## 2021-04-05 ENCOUNTER — Encounter: Payer: Self-pay | Admitting: Internal Medicine

## 2021-04-05 ENCOUNTER — Ambulatory Visit: Payer: BC Managed Care – PPO | Admitting: Internal Medicine

## 2021-04-05 VITALS — BP 124/84 | HR 90 | Temp 98.6°F | Resp 16 | Ht 66.0 in | Wt 233.7 lb

## 2021-04-05 DIAGNOSIS — B351 Tinea unguium: Secondary | ICD-10-CM

## 2021-04-05 DIAGNOSIS — Z720 Tobacco use: Secondary | ICD-10-CM

## 2021-04-05 DIAGNOSIS — E1165 Type 2 diabetes mellitus with hyperglycemia: Secondary | ICD-10-CM

## 2021-04-05 DIAGNOSIS — Z1211 Encounter for screening for malignant neoplasm of colon: Secondary | ICD-10-CM | POA: Diagnosis not present

## 2021-04-05 DIAGNOSIS — G4733 Obstructive sleep apnea (adult) (pediatric): Secondary | ICD-10-CM | POA: Diagnosis not present

## 2021-04-05 MED ORDER — TERBINAFINE HCL 250 MG PO TABS: 250.0000 mg | ORAL_TABLET | Freq: Every day | ORAL | 0 refills | Status: AC

## 2021-04-05 NOTE — Assessment & Plan Note (Signed)
Check CMp today, restart Lamisil and recheck liver enzymes again at follow up.  ?

## 2021-04-05 NOTE — Assessment & Plan Note (Signed)
Stable, doing well with CPAP. 

## 2021-04-05 NOTE — Assessment & Plan Note (Signed)
A1c check today, discussed restarting Metformin 750 ER daily and will consider adding GLP which will suit his lifestyle better. Follow up in 3 months.  ?

## 2021-04-05 NOTE — Patient Instructions (Addendum)
It was great seeing you today! ? ?Plan discussed at today's visit: ?-Blood work ordered today, results will be uploaded to MyChart.  ?-Continue Metformin extended release, can take at night ?-Will check A1c today and will try to get injectable diabetes medication covered ?-Risk of cardiovascular event in the next 10 years about 14%, work on diet and cutting back on smoking to decrease this risk ?-Cologuard ordered for colon cancer screening  ? ?Follow up in: 3 months  ? ?Take care and let us know if you have any questions or concerns prior to your next visit. ? ?Dr. Caralee Ates ? ?

## 2021-04-05 NOTE — Progress Notes (Signed)
? ?Established Patient Office Visit ? ?Subjective:  ?Patient ID: Alvin Hartshorn Krajewski, male    DOB: 03-30-1970  Age: 51 y.o. MRN: [id number] ? ?CC:  ?Chief Complaint  ?Patient presents with  ? Follow-up  ? Diabetes  ? ? ?HPI ?Alvin Green presents for follow up on chronic medical conditions.  ? ?Diabetes, Type 2: ?-Last A1c 1/23 8.7% ?-Medications: Started on Metformin ER 750 in January - had been taking Metformin 500 mg once daily but stopped due to diarrhea. Had some GI symptoms. Today states that he takes the Metformin about once a week, doesn't have side effects but has been forgetting to take it due to his demanding job ?-Checking BG at home: Checks occasionally, random about 120 ?-Diet: Eating cleaner since diagnosis  ?-Exercise: Not currently  ?-Eye exam: UTD 6/22 ?-Foot exam: UTD 1/23 ?-Microalbumin: 6/22 WNL ?-Statin: No ?-PNA vaccine:UTD ?-Denies symptoms of hypoglycemia, polyuria, polydipsia, numbness extremities, foot ulcers/trauma.  ? ?The 10-year ASCVD risk score (Arnett DK, et al., 2019) is: 14.1% ?  Values used to calculate the score: ?    Age: 37 years ?    Sex: Male ?    Is Non-Hispanic African American: Yes ?    Diabetic: Yes ?    Tobacco smoker: Yes ?    Systolic Blood Pressure: 409 mmHg ?    Is BP treated: No ?    HDL Cholesterol: 59 mg/dL ?    Total Cholesterol: 162 mg/dL ? ?OSA/Obesity: ?-CPAP nightly, working on lifestyle change ? ?Health Maintenance: ?-Blood work up to date, recheck A1c ?-Colon cancer screening: due ? ? ?Past Medical History:  ?Diagnosis Date  ? Acquired trigger finger 08/02/2017  ? Decreased libido   ? Heart murmur   ? Obesity   ? Sleep apnea in adult   ? Sprain of metacarpophalangeal joint 08/02/2017  ? Type 2 diabetes mellitus ([place]) 04/28/2016  ? ? ?Past Surgical History:  ?Procedure Laterality Date  ? CARPAL TUNNEL RELEASE    ? CARPAL TUNNEL RELEASE Right   ? approximately 10 years ago  ? ? ?Family History  ?Problem Relation Age of Onset  ? Hypertension Mother   ?  Sleep disorder Father   ? Hypertension Brother   ? ? ?Social History  ? ?Socioeconomic History  ? Marital status: Married  ?  Spouse name: Alvin Green  ? Number of children: 2  ? Years of education: Not on file  ? Highest education level: Some college, no degree  ?Occupational History  ? Not on file  ?Tobacco Use  ? Smoking status: Every Day  ?  Packs/day: 0.50  ?  Years: 24.00  ?  Pack years: 12.00  ?  Types: Cigarettes  ? Smokeless tobacco: Never  ? Tobacco comments:  ?  most of years less than half a pack per day   ?Vaping Use  ? Vaping Use: Never used  ?Substance and Sexual Activity  ? Alcohol use: Yes  ?  Alcohol/week: 0.0 standard drinks  ?  Comment: occasionally  ? Drug use: No  ? Sexual activity: Yes  ?  Partners: Female  ?  Birth control/protection: None  ?Other Topics Concern  ? Not on file  ?Social History Narrative  ? Works at [employer]  ? ?Social Determinants of Health  ? ?Financial Resource Strain: Not on file  ?Food Insecurity: Not on file  ?Transportation Needs: Not on file  ?Physical Activity: Not on file  ?Stress: Not on file  ?Social Connections: Not on file  ?  Intimate Partner Violence: Not on file  ? ? ?Outpatient Medications Prior to Visit  ?Medication Sig Dispense Refill  ? aspirin EC 81 MG tablet Take 1 tablet (81 mg total) by mouth daily. 90 tablet 0  ? metFORMIN (GLUCOPHAGE XR) 750 MG 24 hr tablet Take 1 tablet (750 mg total) by mouth daily with breakfast. 90 tablet 3  ? terbinafine (LAMISIL) 250 MG tablet Take 1 tablet (250 mg total) by mouth daily. 84 tablet 0  ? ?No facility-administered medications prior to visit.  ? ? ?No Known Allergies ? ?ROS ?Review of Systems  ?Constitutional:  Negative for chills and fever.  ?Eyes:  Negative for visual disturbance.  ?Respiratory:  Negative for cough.   ?Cardiovascular:  Negative for chest pain.  ?Gastrointestinal:  Negative for abdominal pain and diarrhea.  ?Skin: Negative.   ?Neurological:  Negative for dizziness and headaches.  ? ?  ?Objective:  ?   ?Physical Exam ?Vitals reviewed.  ?Constitutional:   ?   Appearance: Normal appearance.  ?HENT:  ?   Head: Normocephalic and atraumatic.  ?Eyes:  ?   Conjunctiva/sclera: Conjunctivae normal.  ?Cardiovascular:  ?   Rate and Rhythm: Normal rate and regular rhythm.  ?Pulmonary:  ?   Effort: Pulmonary effort is normal.  ?   Breath sounds: Normal breath sounds.  ?Skin: ?   General: Skin is warm and dry.  ?Neurological:  ?   General: No focal deficit present.  ?   Mental Status: He is alert. Mental status is at baseline.  ?Psychiatric:     ?   Mood and Affect: Mood normal.     ?   Behavior: Behavior normal.  ? ? ?BP 124/84   Pulse 90   Temp 98.6 ?F (37 ?C)   Resp 16   Ht _0  (1.676 m)   Wt 233 lb 11.2 oz (106 kg)   SpO2 98%   BMI 37.72 kg/m?  ?Wt Readings from Last 3 Encounters:  ?04/05/21 233 lb 11.2 oz (106 kg)  ?01/04/21 235 lb 3.2 oz (106.7 kg)  ?09/20/20 236 lb (107 kg)  ? ? ? ?Health Maintenance Due  ?Topic Date Due  ? TETANUS/TDAP  Never done  ? COLONOSCOPY (Pts 45-76yr Insurance coverage will need to be confirmed)  Never done  ? COVID-19 Vaccine (3 - Booster for Pfizer series) 08/13/2019  ? Zoster Vaccines- Shingrix (1 of 2) Never done  ? ? ?There are no preventive care reminders to display for this patient. ? ?Lab Results  ?Component Value Date  ? TSH 2.03 04/28/2016  ? ?Lab Results  ?Component Value Date  ? WBC 7.5 01/04/2021  ? HGB 13.7 01/04/2021  ? HCT 42.8 01/04/2021  ? MCV 89.5 01/04/2021  ? PLT 168 01/04/2021  ? ?Lab Results  ?Component Value Date  ? NA 136 01/04/2021  ? K 4.9 01/04/2021  ? CO2 27 01/04/2021  ? GLUCOSE 211 (H) 01/04/2021  ? BUN 13 01/04/2021  ? CREATININE 0.89 01/04/2021  ? BILITOT 0.5 01/04/2021  ? ALKPHOS 91 04/28/2016  ? AST 15 01/04/2021  ? ALT 26 01/04/2021  ? PROT 7.1 01/04/2021  ? ALBUMIN 4.2 04/28/2016  ? CALCIUM 9.9 01/04/2021  ? EGFR 104 01/04/2021  ? ?Lab Results  ?Component Value Date  ? CHOL 162 01/04/2021  ? ?Lab Results  ?Component Value Date  ? HDL 59 01/04/2021   ? ?Lab Results  ?Component Value Date  ? LHanover85 01/04/2021  ? ?Lab Results  ?Component Value Date  ?  TRIG 86 01/04/2021  ? ?Lab Results  ?Component Value Date  ? CHOLHDL 2.7 01/04/2021  ? ?Lab Results  ?Component Value Date  ? HGBA1C 8.7 (H) 01/04/2021  ? ? ?  ?Assessment & Plan:  ? ?Problem List Items Addressed This Visit   ? ?  ? Respiratory  ? Obstructive sleep apnea  ?  Stable, doing well with CPAP. ?  ?  ?  ? Endocrine  ? Type 2 diabetes mellitus (HCC) - Primary (Chronic)  ?  A1c check today, discussed restarting Metformin 750 ER daily and will consider adding GLP which will suit his lifestyle better. Follow up in 3 months.  ?  ?  ? Relevant Orders  ? HgB A1c  ?  ? Musculoskeletal and Integument  ? Onychomycosis  ?  Check CMp today, restart Lamisil and recheck liver enzymes again at follow up.  ?  ?  ? Relevant Medications  ? terbinafine (LAMISIL) 250 MG tablet  ? Other Relevant Orders  ? COMPLETE METABOLIC PANEL WITH GFR  ?  ? Other  ? Tobacco abuse  ?  Continue to work on tobacco cessation to help decrease ASCVD risk.  ?  ?  ? ?Other Visit Diagnoses   ? ? Screening for colon cancer      ? Relevant Orders  ? Cologuard  ? ?  ? ? ?Follow-up: Return in about 3 months (around 07/05/2021) for follow up on diabetes.  ? ? ?Teodora Medici, DO ?

## 2021-04-05 NOTE — Assessment & Plan Note (Signed)
Continue to work on tobacco cessation to help decrease ASCVD risk.  ?

## 2021-04-06 LAB — COMPLETE METABOLIC PANEL WITH GFR
AG Ratio: 1.5 (calc) (ref 1.0–2.5)
ALT: 22 U/L (ref 9–46)
AST: 16 U/L (ref 10–35)
Albumin: 4.1 g/dL (ref 3.6–5.1)
Alkaline phosphatase (APISO): 64 U/L (ref 35–144)
BUN: 16 mg/dL (ref 7–25)
CO2: 25 mmol/L (ref 20–32)
Calcium: 9.6 mg/dL (ref 8.6–10.3)
Chloride: 106 mmol/L (ref 98–110)
Creat: 0.87 mg/dL (ref 0.70–1.30)
Globulin: 2.7 g/dL (calc) (ref 1.9–3.7)
Glucose, Bld: 170 mg/dL — ABNORMAL HIGH (ref 65–99)
Potassium: 4.9 mmol/L (ref 3.5–5.3)
Sodium: 139 mmol/L (ref 135–146)
Total Bilirubin: 0.4 mg/dL (ref 0.2–1.2)
Total Protein: 6.8 g/dL (ref 6.1–8.1)
eGFR: 105 mL/min/{1.73_m2} (ref >=60)

## 2021-04-06 LAB — HEMOGLOBIN A1C
Hgb A1c MFr Bld: 7.8 % of total Hgb — ABNORMAL HIGH (ref <5.7)
Mean Plasma Glucose: 177 mg/dL
eAG (mmol/L): 9.8 mmol/L

## 2021-04-21 DIAGNOSIS — Z1211 Encounter for screening for malignant neoplasm of colon: Secondary | ICD-10-CM | POA: Diagnosis not present

## 2021-04-30 LAB — COLOGUARD: COLOGUARD: NEGATIVE

## 2021-07-11 NOTE — Progress Notes (Unsigned)
Established Patient Office Visit  Subjective:  Patient ID: Alvin Green, male    DOB: [birth year]  Age: 51 y.o. MRN: [id number]  CC:  No chief complaint on file.   HPI Alvin Green presents for follow up on chronic medical conditions.   Diabetes, Type 2: -Last A1c 04/05/21 7.8% -Medications: Restarted on Metformin ER 750 mg at LOV - had been taking Metformin 500 mg once daily but stopped due to diarrhea. Today states that he takes the Metformin about once a week, doesn't have side effects but has been forgetting to take it due to his demanding job -Checking BG at home: Checks occasionally, random about 120 -Diet: Eating cleaner since diagnosis  -Exercise: Not currently  -Eye exam: Due  -Foot exam: UTD 1/23 -Microalbumin: 6/22 WNL -Statin: No -PNA vaccine:UTD -Denies symptoms of hypoglycemia, polyuria, polydipsia, numbness extremities, foot ulcers/trauma.   The 10-year ASCVD risk score (Arnett DK, et al., 2019) is: 14.7%   Values used to calculate the score:     Age: 40 years     Sex: Male     Is Non-Hispanic African American: Yes     Diabetic: Yes     Tobacco smoker: Yes     Systolic Blood Pressure: 150 mmHg     Is BP treated: No     HDL Cholesterol: 59 mg/dL     Total Cholesterol: 162 mg/dL  OSA/Obesity: -CPAP nightly, working on lifestyle change  Health Maintenance: -Blood work up to date, recheck A1c -Colon cancer screening: Cologuard 4/23 negative    Past Medical History:  Diagnosis Date   Acquired trigger finger 08/02/2017   Decreased libido    Heart murmur    Obesity    Sleep apnea in adult    Sprain of metacarpophalangeal joint 08/02/2017   Type 2 diabetes mellitus ([place]) 04/28/2016    Past Surgical History:  Procedure Laterality Date   CARPAL TUNNEL RELEASE     CARPAL TUNNEL RELEASE Right    approximately 10 years ago    Family History  Problem Relation Age of Onset   Hypertension Mother    Sleep disorder Father    Hypertension  Brother     Social History   Socioeconomic History   Marital status: Married    Spouse name: Dewaine Oats   Number of children: 2   Years of education: Not on file   Highest education level: Some college, no degree  Occupational History   Not on file  Tobacco Use   Smoking status: Every Day    Packs/day: 0.50    Years: 24.00    Total pack years: 12.00    Types: Cigarettes   Smokeless tobacco: Never   Tobacco comments:    most of years less than half a pack per day   Vaping Use   Vaping Use: Never used  Substance and Sexual Activity   Alcohol use: Yes    Alcohol/week: 0.0 standard drinks of alcohol    Comment: occasionally   Drug use: No   Sexual activity: Yes    Partners: Female    Birth control/protection: None  Other Topics Concern   Not on file  Social History Narrative   Works at [employer] Strain: Low Risk  (03/04/2018)   Overall Financial Resource Strain (CARDIA)    Difficulty of Paying Living Expenses: Not hard at all  Food Insecurity: No Food Insecurity (03/04/2018)   Hunger Vital Sign  Worried About Charity fundraiser in the Last Year: Never true    [place] in the Last Year: Never true  Transportation Needs: No Transportation Needs (03/04/2018)   PRAPARE - Hydrologist (Medical): No    Lack of Transportation (Non-Medical): No  Physical Activity: Inactive (03/04/2018)   Exercise Vital Sign    Days of Exercise per Week: 0 days    Minutes of Exercise per Session: 0 min  Stress: Stress Concern Present (03/04/2018)   [place]    Feeling of Stress : To some extent  Social Connections: Somewhat Isolated (03/04/2018)   Social Connection and Isolation Panel [NHANES]    Frequency of Communication with Friends and Family: More than three times a week    Frequency of Social Gatherings with Friends and Family: Never     Attends Religious Services: Never    Marine scientist or Organizations: No    Attends Archivist Meetings: Never    Marital Status: Married  Human resources officer Violence: Not At Risk (03/04/2018)   Humiliation, Afraid, Rape, and Kick questionnaire    Fear of Current or Ex-Partner: No    Emotionally Abused: No    Physically Abused: No    Sexually Abused: No    Outpatient Medications Prior to Visit  Medication Sig Dispense Refill   aspirin EC 81 MG tablet Take 1 tablet (81 mg total) by mouth daily. 90 tablet 0   metFORMIN (GLUCOPHAGE XR) 750 MG 24 hr tablet Take 1 tablet (750 mg total) by mouth daily with breakfast. 90 tablet 3   No facility-administered medications prior to visit.    No Known Allergies  ROS Review of Systems  Constitutional:  Negative for chills and fever.  Eyes:  Negative for visual disturbance.  Respiratory:  Negative for cough.   Cardiovascular:  Negative for chest pain.  Gastrointestinal:  Negative for abdominal pain and diarrhea.  Skin: Negative.   Neurological:  Negative for dizziness and headaches.      Objective:    Physical Exam Vitals reviewed.  Constitutional:      Appearance: Normal appearance.  HENT:     Head: Normocephalic and atraumatic.  Eyes:     Conjunctiva/sclera: Conjunctivae normal.  Cardiovascular:     Rate and Rhythm: Normal rate and regular rhythm.  Pulmonary:     Effort: Pulmonary effort is normal.     Breath sounds: Normal breath sounds.  Skin:    General: Skin is warm and dry.  Neurological:     General: No focal deficit present.     Mental Status: He is alert. Mental status is at baseline.  Psychiatric:        Mood and Affect: Mood normal.        Behavior: Behavior normal.     There were no vitals taken for this visit. Wt Readings from Last 3 Encounters:  04/05/21 233 lb 11.2 oz (106 kg)  01/04/21 235 lb 3.2 oz (106.7 kg)  09/20/20 236 lb (107 kg)     Health Maintenance Due  Topic Date Due    COLONOSCOPY (Pts 45-29yr Insurance coverage will need to be confirmed)  Never done   COVID-19 Vaccine (3 - Pfizer series) 08/13/2019   Zoster Vaccines- Shingrix (1 of 2) Never done   OPHTHALMOLOGY EXAM  05/02/2021   URINE MICROALBUMIN  06/08/2021    There are no preventive care reminders to display for this  patient.  Lab Results  Component Value Date   TSH 2.03 04/28/2016   Lab Results  Component Value Date   WBC 7.5 01/04/2021   HGB 13.7 01/04/2021   HCT 42.8 01/04/2021   MCV 89.5 01/04/2021   PLT 168 01/04/2021   Lab Results  Component Value Date   NA 139 04/05/2021   K 4.9 04/05/2021   CO2 25 04/05/2021   GLUCOSE 170 (H) 04/05/2021   BUN 16 04/05/2021   CREATININE 0.87 04/05/2021   BILITOT 0.4 04/05/2021   ALKPHOS 91 04/28/2016   AST 16 04/05/2021   ALT 22 04/05/2021   PROT 6.8 04/05/2021   ALBUMIN 4.2 04/28/2016   CALCIUM 9.6 04/05/2021   EGFR 105 04/05/2021   Lab Results  Component Value Date   CHOL 162 01/04/2021   Lab Results  Component Value Date   HDL 59 01/04/2021   Lab Results  Component Value Date   LDLCALC 85 01/04/2021   Lab Results  Component Value Date   TRIG 86 01/04/2021   Lab Results  Component Value Date   CHOLHDL 2.7 01/04/2021   Lab Results  Component Value Date   HGBA1C 7.8 (H) 04/05/2021      Assessment & Plan:   Problem List Items Addressed This Visit   None  Follow-up: No follow-ups on file.    Teodora Medici, DO

## 2021-07-12 ENCOUNTER — Ambulatory Visit: Payer: BC Managed Care – PPO | Admitting: Internal Medicine

## 2021-07-12 ENCOUNTER — Encounter: Payer: Self-pay | Admitting: Internal Medicine

## 2021-07-12 VITALS — BP 136/86 | HR 83 | Temp 98.0°F | Resp 16 | Ht 66.0 in | Wt 233.0 lb

## 2021-07-12 DIAGNOSIS — E1165 Type 2 diabetes mellitus with hyperglycemia: Secondary | ICD-10-CM | POA: Diagnosis not present

## 2021-07-12 LAB — POCT GLYCOSYLATED HEMOGLOBIN (HGB A1C): Hemoglobin A1C: 7.8 % — AB (ref 4.0–5.6)

## 2021-07-12 NOTE — Patient Instructions (Addendum)
It was great seeing you today!  Plan discussed at today's visit: -A1c today 7.8%, increase Metformin to 750 mg twice a day -Continue to work on diet -Call to schedule eye exam -Consider statin medication, information provided below  Follow up in: 3 months   Take care and let us know if you have any questions or concerns prior to your next visit.  Dr. Caralee Ates  Simvastatin Tablets What is this medication? SIMVASTATIN (SIM va sta tin) treats high cholesterol and reduces the risk of heart attack and stroke. It works by decreasing the bad cholesterol and fats (such as LDL, triglycerides), and increasing good cholesterol (HDL) in your blood. It belongs to a group of medications called statins. Changes to diet and exercise are often combined with this medication. This medicine may be used for other purposes; ask your health care provider or pharmacist if you have questions. COMMON BRAND NAME(S): Zocor What should I tell my care team before I take this medication? They need to know if you have any of these conditions: Diabetes (high blood sugar) If you often drink alcohol Kidney disease Liver disease Muscle cramps, pain Stroke Thyroid disease An unusual or allergic reaction to simvastatin, other medications, foods, dyes, or preservatives Pregnant or trying to get pregnant Breast-feeding How should I use this medication? Take this medication by mouth. Take it as directed on the prescription label at the same time every day. You can take it with or without food. If it upsets your stomach, take it with food. Keep taking it unless your care team tells you to stop. Do not take this medication with grapefruit juice. Talk to your care team about the use of this medication in children. While it may be prescribed for children as young as 10 for selected conditions, precautions do apply. Overdosage: If you think you have taken too much of this medicine contact a poison control center or emergency  room at once. NOTE: This medicine is only for you. Do not share this medicine with others. What if I miss a dose? If you miss a dose, take it as soon as you can. If it is almost time for your next dose, take only that dose. Do not take double or extra doses. What may interact with this medication? Do not take this medication with any of the following: Antiviral medications for HIV or AIDS Certain antibiotics like clarithromycin, erythromycin, telithromycin Certain medications for fungal infections like ketoconazole, itraconazole, posaconazole, voriconazole Conivaptan Cyclosporine Danazol Gemfibrozil Idelalisib Mifepristone, RU-486 Nefazodone Supplements like red yeast rice This medication may also interact with the following: Alcohol Certain medications for blood pressure or heart disease like amlodipine, diltiazem, verapamil Certain medications for irregular heart beat like amiodarone and dronedarone Certain medications that treat or prevent blood clots like warfarin Colchicine Digoxin Grapefruit juice Lomitapide Niacin Ranolazine This list may not describe all possible interactions. Give your health care provider a list of all the medicines, herbs, non-prescription drugs, or dietary supplements you use. Also tell them if you smoke, drink alcohol, or use illegal drugs. Some items may interact with your medicine. What should I watch for while using this medication? Visit your health care provider for regular checks on your progress. Tell your health care provider if your symptoms do not start to get better or if they get worse. Your health care provider may tell you to stop taking this medication if you develop muscle problems. If your muscle problems do not go away after stopping this medication, contact your health  care provider. Do not become pregnant while taking this medication. Women should inform their health care provider if they wish to become pregnant or think they might be  pregnant. There is potential for serious harm to an unborn child. Talk to your health care provider for more information. Do not breast-feed an infant while taking this medication. This medication may increase blood sugar. Ask your health care provider if changes in diet or medications are needed if you have diabetes. If you are going to need surgery or other procedure, tell your health care provider that you are using this medication. Taking this medication is only part of a total heart healthy program. Your health care provider may give you a special diet to follow. Avoid alcohol. Avoid smoking. Ask your health care provider how much you should exercise. What side effects may I notice from receiving this medication? Side effects that you should report to your care team as soon as possible: Allergic reactions--skin rash, itching, hives, swelling of the face, lips, tongue, or throat High blood sugar (hyperglycemia)--increased thirst or amount of urine, unusual weakness, fatigue, blurry vision Liver injury--right upper belly pain, loss of appetite, nausea, light-colored stool, dark yellow or brown urine, yellowing skin or eyes, unusual weakness, fatigue Muscle injury--unusual weakness, fatigue, muscle pain, dark yellow or brown urine, decrease in amount of urine Redness, blistering, peeling or loosening of the skin, including inside the mouth Side effects that usually do not require medical attention (report to your care team if they continue or are bothersome): Constipation Headache Nausea Stomach pain This list may not describe all possible side effects. Call your doctor for medical advice about side effects. You may report side effects to FDA at 1-800-FDA-1088. Where should I keep my medication? Keep out of the reach of children and pets. Store at room temperature between 20 and 25 degrees C (68 and 77 degrees F). Get rid of any unused medication after the expiration date. To get rid of  medications that are no longer needed or have expired: Take the medication to a medication take-back program. Check with your pharmacy or law enforcement to find a location. If you cannot return the medication, check the label or package insert to see if the medication should be thrown out in the garbage or flushed down the toilet. If you are not sure, ask your care team. If it is safe to put it in the trash, take the medication out of the container. Mix the medication with cat litter, dirt, coffee grounds, or other unwanted substance. Seal the mixture in a bag or container. Put it in the trash. NOTE: This sheet is a summary. It may not cover all possible information. If you have questions about this medicine, talk to your doctor, pharmacist, or health care provider.  2023 Elsevier/Gold Standard (2020-01-16 00:00:00)

## 2021-07-12 NOTE — Assessment & Plan Note (Signed)
A1c here today 7.8% again despite increasing medication, only taking it 4 times a week. Increase Metformin further to 750 mg BID and continue to check blood sugars at home. Urine microalbumin due today. Will call eye doctor to schedule annual eye exam. Discussed last lipid panel from 1/23, which was without abnormalities but overall ASCVD risk 17.2%. Recommend statin therapy, discussed benefit and potential side effects with the patient. Patient unsure, literature about statin class provided and will continue conversation at next visit.  Follow up here in 3 months for recheck.

## 2021-07-13 LAB — MICROALBUMIN / CREATININE URINE RATIO
Creatinine, Urine: 126 mg/dL (ref 20–320)
Microalb Creat Ratio: 31 mcg/mg creat — ABNORMAL HIGH (ref <30)
Microalb, Ur: 3.9 mg/dL

## 2021-10-18 ENCOUNTER — Ambulatory Visit: Payer: BC Managed Care – PPO | Admitting: Internal Medicine

## 2021-10-18 ENCOUNTER — Encounter: Payer: Self-pay | Admitting: Internal Medicine

## 2021-10-18 VITALS — BP 134/84 | HR 92 | Temp 98.2°F | Resp 18 | Ht 66.0 in | Wt 232.4 lb

## 2021-10-18 DIAGNOSIS — E1165 Type 2 diabetes mellitus with hyperglycemia: Secondary | ICD-10-CM

## 2021-10-18 DIAGNOSIS — Z23 Encounter for immunization: Secondary | ICD-10-CM | POA: Diagnosis not present

## 2021-10-18 LAB — POCT GLYCOSYLATED HEMOGLOBIN (HGB A1C): Hemoglobin A1C: 7.3 % — AB (ref 4.0–5.6)

## 2021-10-18 NOTE — Progress Notes (Signed)
Established Patient Office Visit  Subjective:  Patient ID: Alvin Green, male    DOB: 1970/04/24  Age: 51 y.o. MRN: [id number]  CC:  Chief Complaint  Patient presents with   Follow-up   Diabetes    HPI Alvin Green presents for follow up on chronic medical conditions.   Diabetes, Type 2: -Last A1c 04/05/21 7.8% -Diagnosed 2019  -Medications: Metformin ER 750 mg BID, tolerating well  -Checking BG at home: Checks occasionally, fasting 120  -Diet: Trying not to eat out as much, less sugar in diet -Exercise: Not currently  -Eye exam: Due, last one in May 2022 - has to schedule -Foot exam: UTD 1/23 -Microalbumin: UTD 7/23 -Statin: No, did discuss today, patient wants to recheck labs in January and reassess risk then -PNA vaccine: UTD -Denies symptoms of hypoglycemia, polyuria, polydipsia, numbness extremities, foot ulcers/trauma.   The 10-year ASCVD risk score (Arnett DK, et al., 2019) is: 16.8%   Values used to calculate the score:     Age: 55 years     Sex: Male     Is Non-Hispanic African American: Yes     Diabetic: Yes     Tobacco smoker: Yes     Systolic Blood Pressure: 169 mmHg     Is BP treated: No     HDL Cholesterol: 59 mg/dL     Total Cholesterol: 162 mg/dL  OSA/Obesity: -CPAP nightly, working on lifestyle change  Health Maintenance: -Blood work up to date, recheck A1c -Colon cancer screening: Cologuard 4/23 negative    Past Medical History:  Diagnosis Date   Acquired trigger finger 08/02/2017   Decreased libido    Heart murmur    Obesity    Sleep apnea in adult    Sprain of metacarpophalangeal joint 08/02/2017   Type 2 diabetes mellitus ([place]) 04/28/2016    Past Surgical History:  Procedure Laterality Date   CARPAL TUNNEL RELEASE     CARPAL TUNNEL RELEASE Right    approximately 10 years ago    Family History  Problem Relation Age of Onset   Hypertension Mother    Sleep disorder Father    Hypertension Brother     Social  History   Socioeconomic History   Marital status: Married    Spouse name: Dewaine Oats   Number of children: 2   Years of education: Not on file   Highest education level: Some college, no degree  Occupational History   Not on file  Tobacco Use   Smoking status: Every Day    Packs/day: 0.50    Years: 24.00    Total pack years: 12.00    Types: Cigarettes   Smokeless tobacco: Never   Tobacco comments:    most of years less than half a pack per day   Vaping Use   Vaping Use: Never used  Substance and Sexual Activity   Alcohol use: Yes    Alcohol/week: 0.0 standard drinks of alcohol    Comment: occasionally   Drug use: No   Sexual activity: Yes    Partners: Female    Birth control/protection: None  Other Topics Concern   Not on file  Social History Narrative   Works at [employer] Strain: Low Risk  (03/04/2018)   Overall Financial Resource Strain (CARDIA)    Difficulty of Paying Living Expenses: Not hard at all  Food Insecurity: No Food Insecurity (03/04/2018)   Hunger Vital Sign    Worried  About Running Out of Food in the Last Year: Never true    Ran Out of Food in the Last Year: Never true  Transportation Needs: No Transportation Needs (03/04/2018)   PRAPARE - Hydrologist (Medical): No    Lack of Transportation (Non-Medical): No  Physical Activity: Inactive (03/04/2018)   Exercise Vital Sign    Days of Exercise per Week: 0 days    Minutes of Exercise per Session: 0 min  Stress: Stress Concern Present (03/04/2018)   [place]    Feeling of Stress : To some extent  Social Connections: Somewhat Isolated (03/04/2018)   Social Connection and Isolation Panel [NHANES]    Frequency of Communication with Friends and Family: More than three times a week    Frequency of Social Gatherings with Friends and Family: Never    Attends Religious  Services: Never    Marine scientist or Organizations: No    Attends Archivist Meetings: Never    Marital Status: Married  Human resources officer Violence: Not At Risk (03/04/2018)   Humiliation, Afraid, Rape, and Kick questionnaire    Fear of Current or Ex-Partner: No    Emotionally Abused: No    Physically Abused: No    Sexually Abused: No    Outpatient Medications Prior to Visit  Medication Sig Dispense Refill   aspirin EC 81 MG tablet Take 1 tablet (81 mg total) by mouth daily. 90 tablet 0   metFORMIN (GLUCOPHAGE XR) 750 MG 24 hr tablet Take 1 tablet (750 mg total) by mouth daily with breakfast. 90 tablet 3   No facility-administered medications prior to visit.    No Known Allergies  ROS Review of Systems  Constitutional:  Negative for chills and fever.  Eyes:  Negative for visual disturbance.  Respiratory:  Negative for cough.   Cardiovascular:  Negative for chest pain.  Gastrointestinal:  Negative for abdominal pain and diarrhea.  Neurological:  Negative for dizziness and headaches.      Objective:    Physical Exam Vitals reviewed.  Constitutional:      Appearance: Normal appearance.  HENT:     Head: Normocephalic and atraumatic.  Eyes:     Conjunctiva/sclera: Conjunctivae normal.  Cardiovascular:     Rate and Rhythm: Normal rate and regular rhythm.  Pulmonary:     Effort: Pulmonary effort is normal.     Breath sounds: Normal breath sounds.  Skin:    General: Skin is warm and dry.  Neurological:     General: No focal deficit present.     Mental Status: He is alert. Mental status is at baseline.  Psychiatric:        Mood and Affect: Mood normal.        Behavior: Behavior normal.     BP 134/84   Pulse 92   Temp 98.2 F (36.8 C)   Resp 18   Ht '5\' 6"'  (1.676 m)   Wt 232 lb 6.4 oz (105.4 kg)   SpO2 100%   BMI 37.51 kg/m  Wt Readings from Last 3 Encounters:  10/18/21 232 lb 6.4 oz (105.4 kg)  07/12/21 233 lb (105.7 kg)  04/05/21 233 lb  11.2 oz (106 kg)     Health Maintenance Due  Topic Date Due   OPHTHALMOLOGY EXAM  05/02/2021    There are no preventive care reminders to display for this patient.  Lab Results  Component Value Date  TSH 2.03 04/28/2016   Lab Results  Component Value Date   WBC 7.5 01/04/2021   HGB 13.7 01/04/2021   HCT 42.8 01/04/2021   MCV 89.5 01/04/2021   PLT 168 01/04/2021   Lab Results  Component Value Date   NA 139 04/05/2021   K 4.9 04/05/2021   CO2 25 04/05/2021   GLUCOSE 170 (H) 04/05/2021   BUN 16 04/05/2021   CREATININE 0.87 04/05/2021   BILITOT 0.4 04/05/2021   ALKPHOS 91 04/28/2016   AST 16 04/05/2021   ALT 22 04/05/2021   PROT 6.8 04/05/2021   ALBUMIN 4.2 04/28/2016   CALCIUM 9.6 04/05/2021   EGFR 105 04/05/2021   Lab Results  Component Value Date   CHOL 162 01/04/2021   Lab Results  Component Value Date   HDL 59 01/04/2021   Lab Results  Component Value Date   LDLCALC 85 01/04/2021   Lab Results  Component Value Date   TRIG 86 01/04/2021   Lab Results  Component Value Date   CHOLHDL 2.7 01/04/2021   Lab Results  Component Value Date   HGBA1C 7.8 (A) 07/12/2021      Assessment & Plan:   1. Type 2 diabetes mellitus with hyperglycemia, without [place]-term current use of insulin ([place]): A1c today 7.3% which is improved. He is more consistent with medications and is doing well with the Metformin XR 750 mg BID. He will call to schedule eye exam. Discussed cardiovascular risk and starting a statin, patient opting to recheck labs next time and re-evaluate risk.   - POCT HgB A1C  2. Need for influenza vaccination: Flu vaccine administered today.  - Flu Vaccine QUAD 6+ mos PF IM (Fluarix Quad PF)   Follow-up: Return in about 3 months (around 01/18/2022).    Teodora Medici, DO

## 2021-10-18 NOTE — Patient Instructions (Addendum)
It was great seeing you today!  Plan discussed at today's visit: -A1c 7.3% which is awesome! Continue medication -Please be fasting for labs at follow up -Flu vaccine today   Follow up in: 3 months   Take care and let us know if you have any questions or concerns prior to your next visit.  Dr. Rosana Berger

## 2022-01-23 NOTE — Progress Notes (Signed)
Established Patient Office Visit  Subjective:  Patient ID: Alvin Green, male    DOB: 1970-08-01  Age: 52 y.o. MRN: [id number]  CC:  Chief Complaint  Patient presents with   Follow-up   Diabetes    HPI Alvin Green presents for follow up on chronic medical conditions. He states he's had no changes or issues since his last visit.   Diabetes, Type 2: -Last A1c 10/23 7.3% -Diagnosed 2019  -Medications: Metformin ER 750 mg BID, tolerating well  -Checking BG at home: Checks occasionally, fasting 110-113  -Diet: Trying not to eat out as much, less sugar in diet -Exercise: Not currently  -Eye exam: Due, appointment scheduled next month -Foot exam: Due today -Microalbumin: UTD 7/23 -Statin: No, did discuss today, patient wants to recheck labs in January and reassess risk then -PNA vaccine: UTD -Denies symptoms of hypoglycemia, polyuria, polydipsia, numbness extremities, foot ulcers/trauma.   HLD/elevated ASCVD risk: -Medications: Nothing currently  -Last lipid panel: Lipid Panel     Component Value Date/Time   CHOL 162 01/04/2021 1122   TRIG 86 01/04/2021 1122   HDL 59 01/04/2021 1122   CHOLHDL 2.7 01/04/2021 1122   VLDL 18 05/30/2016 1052   LDLCALC 85 01/04/2021 1122    The 10-year ASCVD risk score (Arnett DK, et al., 2019) is: 14.3%   Values used to calculate the score:     Age: 71 years     Sex: Male     Is Non-Hispanic African American: Yes     Diabetic: Yes     Tobacco smoker: Yes     Systolic Blood Pressure: 122 mmHg     Is BP treated: No     HDL Cholesterol: 59 mg/dL     Total Cholesterol: 162 mg/dL  OSA/Obesity: -CPAP nightly, working on lifestyle change  Health Maintenance: -Blood work due -Colon cancer screening: Cologuard 4/23 negative    Past Medical History:  Diagnosis Date   Acquired trigger finger 08/02/2017   Decreased libido    Heart murmur    Obesity    Sleep apnea in adult    Sprain of metacarpophalangeal joint  08/02/2017   Type 2 diabetes mellitus ([facility]) 04/28/2016    Past Surgical History:  Procedure Laterality Date   CARPAL TUNNEL RELEASE     CARPAL TUNNEL RELEASE Right    approximately 10 years ago    Family History  Problem Relation Age of Onset   Hypertension Mother    Sleep disorder Father    Hypertension Brother     Social History   Socioeconomic History   Marital status: Married    Spouse name: Bronson Ing   Number of children: 2   Years of education: Not on file   Highest education level: Some college, no degree  Occupational History   Not on file  Tobacco Use   Smoking status: Every Day    Packs/day: 0.50    Years: 24.00    Total pack years: 12.00    Types: Cigarettes   Smokeless tobacco: Never   Tobacco comments:    most of years less than half a pack per day   Vaping Use   Vaping Use: Never used  Substance and Sexual Activity   Alcohol use: Yes    Alcohol/week: 0.0 standard drinks of alcohol    Comment: occasionally   Drug use: No   Sexual activity: Yes    Partners: Female    Birth control/protection: None  Other Topics Concern   Not on  file  Social History Narrative   Works at [employer]: Low Risk  (03/04/2018)   Overall Financial Resource Strain (CARDIA)    Difficulty of Paying Living Expenses: Not hard at all  Food Insecurity: No Food Insecurity (03/04/2018)   Hunger Vital Sign    Worried About Running Out of Food in the Last Year: Never true    [place] in the Last Year: Never true  Transportation Needs: No Transportation Needs (03/04/2018)   PRAPARE - Hydrologist (Medical): No    Lack of Transportation (Non-Medical): No  Physical Activity: Inactive (03/04/2018)   Exercise Vital Sign    Days of Exercise per Week: 0 days    Minutes of Exercise per Session: 0 min  Stress: Stress Concern Present (03/04/2018)   [place]    Feeling of Stress : To some extent  Social Connections: Somewhat Isolated (03/04/2018)   Social Connection and Isolation Panel [NHANES]    Frequency of Communication with Friends and Family: More than three times a week    Frequency of Social Gatherings with Friends and Family: Never    Attends Religious Services: Never    Marine scientist or Organizations: No    Attends Archivist Meetings: Never    Marital Status: Married  Human resources officer Violence: Not At Risk (03/04/2018)   Humiliation, Afraid, Rape, and Kick questionnaire    Fear of Current or Ex-Partner: No    Emotionally Abused: No    Physically Abused: No    Sexually Abused: No    Outpatient Medications Prior to Visit  Medication Sig Dispense Refill   aspirin EC 81 MG tablet Take 1 tablet (81 mg total) by mouth daily. 90 tablet 0   metFORMIN (GLUCOPHAGE XR) 750 MG 24 hr tablet Take 1 tablet (750 mg total) by mouth daily with breakfast. 90 tablet 3   No facility-administered medications prior to visit.    No Known Allergies  ROS Review of Systems  Constitutional:  Negative for chills and fever.  Eyes:  Negative for visual disturbance.  Respiratory:  Negative for cough.   Cardiovascular:  Negative for chest pain.  Gastrointestinal:  Negative for abdominal pain and diarrhea.  Neurological:  Negative for dizziness and headaches.      Objective:    Physical Exam Vitals reviewed.  Constitutional:      Appearance: Normal appearance.  HENT:     Head: Normocephalic and atraumatic.  Eyes:     Conjunctiva/sclera: Conjunctivae normal.  Cardiovascular:     Rate and Rhythm: Normal rate and regular rhythm.     Pulses:          Dorsalis pedis pulses are 2+ on the right side and 2+ on the left side.  Pulmonary:     Effort: Pulmonary effort is normal.     Breath sounds: Normal breath sounds.  Musculoskeletal:     Right lower leg: No edema.     Left lower leg: No edema.      Right foot: Normal range of motion. No deformity, bunion, Charcot foot, foot drop or prominent metatarsal heads.     Left foot: Normal range of motion. No deformity, bunion, Charcot foot, foot drop or prominent metatarsal heads.  Feet:     Right foot:     Protective Sensation: 6 sites tested.  6 sites sensed.  Skin integrity: Skin integrity normal.     Toenail Condition: Right toenails are [place].     Left foot:     Protective Sensation: 6 sites tested.  6 sites sensed.     Skin integrity: Skin integrity normal.     Toenail Condition: Left toenails are [place].  Skin:    General: Skin is warm and dry.  Neurological:     General: No focal deficit present.     Mental Status: He is alert. Mental status is at baseline.  Psychiatric:        Mood and Affect: Mood normal.        Behavior: Behavior normal.     BP 122/80   Pulse 97   Temp 98.3 F (36.8 C)   Resp 18   Ht 5\' 6"  (1.676 m)   Wt 234 lb 1.6 oz (106.2 kg)   SpO2 99%   BMI 37.78 kg/m  Wt Readings from Last 3 Encounters:  01/24/22 234 lb 1.6 oz (106.2 kg)  10/18/21 232 lb 6.4 oz (105.4 kg)  07/12/21 233 lb (105.7 kg)     Health Maintenance Due  Topic Date Due   OPHTHALMOLOGY EXAM  05/02/2021   FOOT EXAM  01/04/2022    There are no preventive care reminders to display for this patient.  Lab Results  Component Value Date   TSH 2.03 04/28/2016   Lab Results  Component Value Date   WBC 7.5 01/04/2021   HGB 13.7 01/04/2021   HCT 42.8 01/04/2021   MCV 89.5 01/04/2021   PLT 168 01/04/2021   Lab Results  Component Value Date   NA 139 04/05/2021   K 4.9 04/05/2021   CO2 25 04/05/2021   GLUCOSE 170 (H) 04/05/2021   BUN 16 04/05/2021   CREATININE 0.87 04/05/2021   BILITOT 0.4 04/05/2021   ALKPHOS 91 04/28/2016   AST 16 04/05/2021   ALT 22 04/05/2021   PROT 6.8 04/05/2021   ALBUMIN 4.2 04/28/2016   CALCIUM 9.6 04/05/2021   EGFR 105 04/05/2021   Lab Results  Component Value Date   CHOL 162 01/04/2021    Lab Results  Component Value Date   HDL 59 01/04/2021   Lab Results  Component Value Date   LDLCALC 85 01/04/2021   Lab Results  Component Value Date   TRIG 86 01/04/2021   Lab Results  Component Value Date   CHOLHDL 2.7 01/04/2021   Lab Results  Component Value Date   HGBA1C 7.3 (A) 10/18/2021      Assessment & Plan:   1. Type 2 diabetes mellitus with hyperglycemia, without [place]-term current use of insulin ([place]): Recheck A1c today. Doing well on medications, Metformin XR 750 mg once daily needing to be refilled. Foot exam today. Follow up in 3 months per patient preference.   - HgB A1c - [facility] Diabetes Foot Exam - metFORMIN (GLUCOPHAGE XR) 750 MG 24 hr tablet; Take 1 tablet (750 mg total) by mouth daily with breakfast.  Dispense: 90 tablet; Refill: 3  2. Lipid screening: Fasting lipid panel today.   - Lipid Profile   Follow-up: Return in about 3 months (around 04/25/2022).    Teodora Medici, DO

## 2022-01-24 ENCOUNTER — Ambulatory Visit: Payer: BC Managed Care – PPO | Admitting: Internal Medicine

## 2022-01-24 ENCOUNTER — Encounter: Payer: Self-pay | Admitting: Internal Medicine

## 2022-01-24 VITALS — BP 122/80 | HR 97 | Temp 98.3°F | Resp 18 | Ht 66.0 in | Wt 234.1 lb

## 2022-01-24 DIAGNOSIS — E1165 Type 2 diabetes mellitus with hyperglycemia: Secondary | ICD-10-CM

## 2022-01-24 DIAGNOSIS — Z13220 Encounter for screening for lipoid disorders: Secondary | ICD-10-CM

## 2022-01-24 MED ORDER — METFORMIN HCL ER 750 MG PO TB24: 750.0000 mg | ORAL_TABLET | Freq: Every day | ORAL | 3 refills | Status: DC

## 2022-01-24 NOTE — Patient Instructions (Signed)
It was great seeing you today!  Plan discussed at today's visit: -Blood work ordered today, results will be uploaded to [facility].  -Metformin refilled  Follow up in: 3 months   Take care and let us know if you have any questions or concerns prior to your next visit.  Dr. Rosana Berger

## 2022-01-25 ENCOUNTER — Other Ambulatory Visit: Payer: Self-pay | Admitting: Internal Medicine

## 2022-01-25 DIAGNOSIS — E1165 Type 2 diabetes mellitus with hyperglycemia: Secondary | ICD-10-CM

## 2022-01-25 DIAGNOSIS — E782 Mixed hyperlipidemia: Secondary | ICD-10-CM

## 2022-01-25 LAB — LIPID PANEL
Cholesterol: 154 mg/dL (ref <200)
HDL: 65 mg/dL (ref >=40)
LDL Cholesterol (Calc): 74 mg/dL (calc)
Non-HDL Cholesterol (Calc): 89 mg/dL (calc) (ref <130)
Total CHOL/HDL Ratio: 2.4 (calc) (ref <5.0)
Triglycerides: 73 mg/dL (ref <150)

## 2022-01-25 LAB — HEMOGLOBIN A1C
Hgb A1c MFr Bld: 9.9 % of total Hgb — ABNORMAL HIGH (ref <5.7)
Mean Plasma Glucose: 237 mg/dL
eAG (mmol/L): 13.2 mmol/L

## 2022-01-25 MED ORDER — ROSUVASTATIN CALCIUM 10 MG PO TABS: 10.0000 mg | ORAL_TABLET | Freq: Every day | ORAL | 1 refills | Status: DC

## 2022-01-25 MED ORDER — METFORMIN HCL ER 750 MG PO TB24: 750.0000 mg | ORAL_TABLET | Freq: Two times a day (BID) | ORAL | 1 refills | Status: DC

## 2022-01-25 NOTE — Addendum Note (Signed)
Addended by: Teodora Medici on: 01/25/2022 02:55 PM   Modules accepted: Orders

## 2022-02-16 LAB — HM DIABETES EYE EXAM

## 2022-05-01 NOTE — Progress Notes (Deleted)
Established Patient Office Visit  Subjective:  Patient ID: Alvin Green, male    DOB: May 26, 1970  Age: 52 y.o. MRN: [id number]  CC:  No chief complaint on file.   HPI Avrum Kimball Crittendon presents for follow up on chronic medical conditions. He states he's had no changes or issues since his last visit.   Diabetes, Type 2: -Last A1c 9.9%, 1/24 -Diagnosed 2019  -Medications: Metformin ER 750 mg BID, tolerating well  -Checking BG at home: Checks occasionally, fasting 110-113  -Diet: Trying not to eat out as much, less sugar in diet -Exercise: Not currently  -Eye exam: Due, appointment scheduled next month -Foot exam: UTD 1/24 -Microalbumin: UTD 7/23 -Statin: No, did discuss today, patient wants to recheck labs in January and reassess risk then -PNA vaccine: UTD -Denies symptoms of hypoglycemia, polyuria, polydipsia, numbness extremities, foot ulcers/trauma.   HLD/elevated ASCVD risk: -Medications: Started on Crestor 10 mg at LOV -Last lipid panel: Lipid Panel     Component Value Date/Time   CHOL 154 01/24/2022 0846   TRIG 73 01/24/2022 0846   HDL 65 01/24/2022 0846   CHOLHDL 2.4 01/24/2022 0846   VLDL 18 05/30/2016 1052   LDLCALC 74 01/24/2022 0846    The 10-year ASCVD risk score (Arnett DK, et al., 2019) is: 13.7%   Values used to calculate the score:     Age: 46 years     Sex: Male     Is Non-Hispanic African American: Yes     Diabetic: Yes     Tobacco smoker: Yes     Systolic Blood Pressure: 122 mmHg     Is BP treated: No     HDL Cholesterol: 65 mg/dL     Total Cholesterol: 154 mg/dL  OSA/Obesity: -CPAP nightly, working on lifestyle change  Health Maintenance: -Blood work due -Colon cancer screening: Cologuard 4/23 negative    Past Medical History:  Diagnosis Date   Acquired trigger finger 08/02/2017   Decreased libido    Heart murmur    Obesity    Sleep apnea in adult    Sprain of metacarpophalangeal joint 08/02/2017   Type 2 diabetes  mellitus (HCC) 04/28/2016    Past Surgical History:  Procedure Laterality Date   CARPAL TUNNEL RELEASE     CARPAL TUNNEL RELEASE Right    approximately 10 years ago    Family History  Problem Relation Age of Onset   Hypertension Mother    Sleep disorder Father    Hypertension Brother     Social History   Socioeconomic History   Marital status: Married    Spouse name: Bronson Ing   Number of children: 2   Years of education: Not on file   Highest education level: Some college, no degree  Occupational History   Not on file  Tobacco Use   Smoking status: Every Day    Packs/day: 0.50    Years: 24.00    Additional pack years: 0.00    Total pack years: 12.00    Types: Cigarettes   Smokeless tobacco: Never   Tobacco comments:    most of years less than half a pack per day   Vaping Use   Vaping Use: Never used  Substance and Sexual Activity   Alcohol use: Yes    Alcohol/week: 0.0 standard drinks of alcohol    Comment: occasionally   Drug use: No   Sexual activity: Yes    Partners: Female    Birth control/protection: None  Other Topics Concern  Not on file  Social History Narrative   Works at [employer]: Low Risk  (03/04/2018)   Overall Financial Resource Strain (CARDIA)    Difficulty of Paying Living Expenses: Not hard at all  Food Insecurity: No Food Insecurity (03/04/2018)   Hunger Vital Sign    Worried About Running Out of Food in the Last Year: Never true    Ran Out of Food in the Last Year: Never true  Transportation Needs: No Transportation Needs (03/04/2018)   PRAPARE - Administrator, Civil Service (Medical): No    Lack of Transportation (Non-Medical): No  Physical Activity: Inactive (03/04/2018)   Exercise Vital Sign    Days of Exercise per Week: 0 days    Minutes of Exercise per Session: 0 min  Stress: Stress Concern Present (03/04/2018)   [employer] of Occupational Health -  Occupational Stress Questionnaire    Feeling of Stress : To some extent  Social Connections: Somewhat Isolated (03/04/2018)   Social Connection and Isolation Panel [NHANES]    Frequency of Communication with Friends and Family: More than three times a week    Frequency of Social Gatherings with Friends and Family: Never    Attends Religious Services: Never    Database administrator or Organizations: No    Attends Banker Meetings: Never    Marital Status: Married  Catering manager Violence: Not At Risk (03/04/2018)   Humiliation, Afraid, Rape, and Kick questionnaire    Fear of Current or Ex-Partner: No    Emotionally Abused: No    Physically Abused: No    Sexually Abused: No    Outpatient Medications Prior to Visit  Medication Sig Dispense Refill   aspirin EC 81 MG tablet Take 1 tablet (81 mg total) by mouth daily. 90 tablet 0   metFORMIN (GLUCOPHAGE XR) 750 MG 24 hr tablet Take 1 tablet (750 mg total) by mouth 2 (two) times daily with a meal. 90 tablet 1   rosuvastatin (CRESTOR) 10 MG tablet Take 1 tablet (10 mg total) by mouth daily. 90 tablet 1   No facility-administered medications prior to visit.    No Known Allergies  ROS Review of Systems  Constitutional:  Negative for chills and fever.  Eyes:  Negative for visual disturbance.  Respiratory:  Negative for cough.   Cardiovascular:  Negative for chest pain.  Gastrointestinal:  Negative for abdominal pain and diarrhea.  Neurological:  Negative for dizziness and headaches.      Objective:    Physical Exam Vitals reviewed.  Constitutional:      Appearance: Normal appearance.  HENT:     Head: Normocephalic and atraumatic.  Eyes:     Conjunctiva/sclera: Conjunctivae normal.  Cardiovascular:     Rate and Rhythm: Normal rate and regular rhythm.     Pulses:          Dorsalis pedis pulses are 2+ on the right side and 2+ on the left side.  Pulmonary:     Effort: Pulmonary effort is normal.     Breath sounds:  Normal breath sounds.  Musculoskeletal:     Right lower leg: No edema.     Left lower leg: No edema.     Right foot: Normal range of motion. No deformity, bunion, Charcot foot, foot drop or prominent metatarsal heads.     Left foot: Normal range of motion. No deformity, bunion, Charcot foot, foot drop or prominent  metatarsal heads.  Feet:     Right foot:     Protective Sensation: 6 sites tested.  6 sites sensed.     Skin integrity: Skin integrity normal.     Toenail Condition: Right toenails are Rex.     Left foot:     Protective Sensation: 6 sites tested.  6 sites sensed.     Skin integrity: Skin integrity normal.     Toenail Condition: Left toenails are [place].  Skin:    General: Skin is warm and dry.  Neurological:     General: No focal deficit present.     Mental Status: He is alert. Mental status is at baseline.  Psychiatric:        Mood and Affect: Mood normal.        Behavior: Behavior normal.     There were no vitals taken for this visit. Wt Readings from Last 3 Encounters:  01/24/22 234 lb 1.6 oz (106.2 kg)  10/18/21 232 lb 6.4 oz (105.4 kg)  07/12/21 233 lb (105.7 kg)     Health Maintenance Due  Topic Date Due   Zoster Vaccines- Shingrix (1 of 2) Never done   OPHTHALMOLOGY EXAM  05/02/2021   COVID-19 Vaccine (3 - 2023-24 season) 09/02/2021   Diabetic kidney evaluation - eGFR measurement  04/06/2022    There are no preventive care reminders to display for this patient.  Lab Results  Component Value Date   TSH 2.03 04/28/2016   Lab Results  Component Value Date   WBC 7.5 01/04/2021   HGB 13.7 01/04/2021   HCT 42.8 01/04/2021   MCV 89.5 01/04/2021   PLT 168 01/04/2021   Lab Results  Component Value Date   NA 139 04/05/2021   K 4.9 04/05/2021   CO2 25 04/05/2021   GLUCOSE 170 (H) 04/05/2021   BUN 16 04/05/2021   CREATININE 0.87 04/05/2021   BILITOT 0.4 04/05/2021   ALKPHOS 91 04/28/2016   AST 16 04/05/2021   ALT 22 04/05/2021   PROT 6.8  04/05/2021   ALBUMIN 4.2 04/28/2016   CALCIUM 9.6 04/05/2021   EGFR 105 04/05/2021   Lab Results  Component Value Date   CHOL 154 01/24/2022   Lab Results  Component Value Date   HDL 65 01/24/2022   Lab Results  Component Value Date   LDLCALC 74 01/24/2022   Lab Results  Component Value Date   TRIG 73 01/24/2022   Lab Results  Component Value Date   CHOLHDL 2.4 01/24/2022   Lab Results  Component Value Date   HGBA1C 9.9 (H) 01/24/2022      Assessment & Plan:   1. Type 2 diabetes mellitus with hyperglycemia, without Biebel-term current use of insulin (HCC): Recheck A1c today. Doing well on medications, Metformin XR 750 mg once daily needing to be refilled. Foot exam today. Follow up in 3 months per patient preference.   - HgB A1c - HM Diabetes Foot Exam - metFORMIN (GLUCOPHAGE XR) 750 MG 24 hr tablet; Take 1 tablet (750 mg total) by mouth daily with breakfast.  Dispense: 90 tablet; Refill: 3  2. Lipid screening: Fasting lipid panel today.   - Lipid Profile   Follow-up: No follow-ups on file.    Margarita Mail, DO

## 2022-05-02 ENCOUNTER — Ambulatory Visit: Payer: BC Managed Care – PPO | Admitting: Internal Medicine

## 2022-05-08 NOTE — Progress Notes (Unsigned)
 Established Patient Office Visit  Subjective:  Patient ID: Alvin Green, male    DOB: 05/01/1970  Age: 51 y.o. MRN: [id number]  CC:  No chief complaint on file.   HPI Alvin Green presents for follow up on chronic medical conditions. He states he's had no changes or issues since his last visit.   Diabetes, Type 2: -Last A1c 9.9%, 1/24 -Diagnosed 2019  -Medications: Metformin ER 750 mg BID, tolerating well  -Checking BG at home: Checks occasionally, fasting 110-113  -Diet: Trying not to eat out as much, less sugar in diet -Exercise: Not currently  -Eye exam: Due, appointment scheduled next month -Foot exam: UTD 1/24 -Microalbumin: UTD 7/23 -Statin: No, did discuss today, patient wants to recheck labs in January and reassess risk then -PNA vaccine: UTD -Denies symptoms of hypoglycemia, polyuria, polydipsia, numbness extremities, foot ulcers/trauma.   HLD/elevated ASCVD risk: -Medications: Started on Crestor 10 mg at LOV -Last lipid panel: Lipid Panel     Component Value Date/Time   CHOL 154 01/24/2022 0846   TRIG 73 01/24/2022 0846   HDL 65 01/24/2022 0846   CHOLHDL 2.4 01/24/2022 0846   VLDL 18 05/30/2016 1052   LDLCALC 74 01/24/2022 0846    The 10-year ASCVD risk score (Arnett DK, et al., 2019) is: 13.7%   Values used to calculate the score:     Age: 51 years     Sex: Male     Is Non-Hispanic African American: Yes     Diabetic: Yes     Tobacco smoker: Yes     Systolic Blood Pressure: 122 mmHg     Is BP treated: No     HDL Cholesterol: 65 mg/dL     Total Cholesterol: 154 mg/dL  OSA/Obesity: -CPAP nightly, working on lifestyle change  Health Maintenance: -Blood work due -Colon cancer screening: Cologuard 4/23 negative    Past Medical History:  Diagnosis Date   Acquired trigger finger 08/02/2017   Decreased libido    Heart murmur    Obesity    Sleep apnea in adult    Sprain of metacarpophalangeal joint 08/02/2017   Type 2 diabetes  mellitus (HCC) 04/28/2016    Past Surgical History:  Procedure Laterality Date   CARPAL TUNNEL RELEASE     CARPAL TUNNEL RELEASE Right    approximately 10 years ago    Family History  Problem Relation Age of Onset   Hypertension Mother    Sleep disorder Father    Hypertension Brother     Social History   Socioeconomic History   Marital status: Married    Spouse name: Yvette   Number of children: 2   Years of education: Not on file   Highest education level: Some college, no degree  Occupational History   Not on file  Tobacco Use   Smoking status: Every Day    Packs/day: 0.50    Years: 24.00    Additional pack years: 0.00    Total pack years: 12.00    Types: Cigarettes   Smokeless tobacco: Never   Tobacco comments:    most of years less than half a pack per day   Vaping Use   Vaping Use: Never used  Substance and Sexual Activity   Alcohol use: Yes    Alcohol/week: 0.0 standard drinks of alcohol    Comment: occasionally   Drug use: No   Sexual activity: Yes    Partners: Female    Birth control/protection: None  Other Topics Concern     Not on file  Social History Narrative   Works at [employer]   Social Determinants of Health   Financial Resource Strain: Low Risk  (03/04/2018)   Overall Financial Resource Strain (CARDIA)    Difficulty of Paying Living Expenses: Not hard at all  Food Insecurity: No Food Insecurity (03/04/2018)   Hunger Vital Sign    Worried About Running Out of Food in the Last Year: Never true    Ran Out of Food in the Last Year: Never true  Transportation Needs: No Transportation Needs (03/04/2018)   PRAPARE - Transportation    Lack of Transportation (Medical): No    Lack of Transportation (Non-Medical): No  Physical Activity: Inactive (03/04/2018)   Exercise Vital Sign    Days of Exercise per Week: 0 days    Minutes of Exercise per Session: 0 min  Stress: Stress Concern Present (03/04/2018)   [employer] -  Occupational Stress Questionnaire    Feeling of Stress : To some extent  Social Connections: Somewhat Isolated (03/04/2018)   Social Connection and Isolation Panel [NHANES]    Frequency of Communication with Friends and Family: More than three times a week    Frequency of Social Gatherings with Friends and Family: Never    Attends Religious Services: Never    Active Member of Clubs or Organizations: No    Attends Club or Organization Meetings: Never    Marital Status: Married  Intimate Partner Violence: Not At Risk (03/04/2018)   Humiliation, Afraid, Rape, and Kick questionnaire    Fear of Current or Ex-Partner: No    Emotionally Abused: No    Physically Abused: No    Sexually Abused: No    Outpatient Medications Prior to Visit  Medication Sig Dispense Refill   aspirin EC 81 MG tablet Take 1 tablet (81 mg total) by mouth daily. 90 tablet 0   metFORMIN (GLUCOPHAGE XR) 750 MG 24 hr tablet Take 1 tablet (750 mg total) by mouth 2 (two) times daily with a meal. 90 tablet 1   rosuvastatin (CRESTOR) 10 MG tablet Take 1 tablet (10 mg total) by mouth daily. 90 tablet 1   No facility-administered medications prior to visit.    No Known Allergies  ROS Review of Systems  Constitutional:  Negative for chills and fever.  Eyes:  Negative for visual disturbance.  Respiratory:  Negative for cough.   Cardiovascular:  Negative for chest pain.  Gastrointestinal:  Negative for abdominal pain and diarrhea.  Neurological:  Negative for dizziness and headaches.      Objective:    Physical Exam Vitals reviewed.  Constitutional:      Appearance: Normal appearance.  HENT:     Head: Normocephalic and atraumatic.  Eyes:     Conjunctiva/sclera: Conjunctivae normal.  Cardiovascular:     Rate and Rhythm: Normal rate and regular rhythm.     Pulses:          Dorsalis pedis pulses are 2+ on the right side and 2+ on the left side.  Pulmonary:     Effort: Pulmonary effort is normal.     Breath sounds:  Normal breath sounds.  Musculoskeletal:     Right lower leg: No edema.     Left lower leg: No edema.     Right foot: Normal range of motion. No deformity, bunion, Charcot foot, foot drop or prominent metatarsal heads.     Left foot: Normal range of motion. No deformity, bunion, Charcot foot, foot drop or prominent   metatarsal heads.  Feet:     Right foot:     Protective Sensation: 6 sites tested.  6 sites sensed.     Skin integrity: Skin integrity normal.     Toenail Condition: Right toenails are [place].     Left foot:     Protective Sensation: 6 sites tested.  6 sites sensed.     Skin integrity: Skin integrity normal.     Toenail Condition: Left toenails are [place].  Skin:    General: Skin is warm and dry.  Neurological:     General: No focal deficit present.     Mental Status: He is alert. Mental status is at baseline.  Psychiatric:        Mood and Affect: Mood normal.        Behavior: Behavior normal.     There were no vitals taken for this visit. Wt Readings from Last 3 Encounters:  01/24/22 234 lb 1.6 oz (106.2 kg)  10/18/21 232 lb 6.4 oz (105.4 kg)  07/12/21 233 lb (105.7 kg)     Health Maintenance Due  Topic Date Due   Zoster Vaccines- Shingrix (1 of 2) Never done   OPHTHALMOLOGY EXAM  05/02/2021   COVID-19 Vaccine (3 - 2023-24 season) 09/02/2021   Diabetic kidney evaluation - eGFR measurement  04/06/2022    There are no preventive care reminders to display for this patient.  Lab Results  Component Value Date   TSH 2.03 04/28/2016   Lab Results  Component Value Date   WBC 7.5 01/04/2021   HGB 13.7 01/04/2021   HCT 42.8 01/04/2021   MCV 89.5 01/04/2021   PLT 168 01/04/2021   Lab Results  Component Value Date   NA 139 04/05/2021   K 4.9 04/05/2021   CO2 25 04/05/2021   GLUCOSE 170 (H) 04/05/2021   BUN 16 04/05/2021   CREATININE 0.87 04/05/2021   BILITOT 0.4 04/05/2021   ALKPHOS 91 04/28/2016   AST 16 04/05/2021   ALT 22 04/05/2021   PROT 6.8  04/05/2021   ALBUMIN 4.2 04/28/2016   CALCIUM 9.6 04/05/2021   EGFR 105 04/05/2021   Lab Results  Component Value Date   CHOL 154 01/24/2022   Lab Results  Component Value Date   HDL 65 01/24/2022   Lab Results  Component Value Date   LDLCALC 74 01/24/2022   Lab Results  Component Value Date   TRIG 73 01/24/2022   Lab Results  Component Value Date   CHOLHDL 2.4 01/24/2022   Lab Results  Component Value Date   HGBA1C 9.9 (H) 01/24/2022      Assessment & Plan:   1. Type 2 diabetes mellitus with hyperglycemia, without Kaelin-term current use of insulin (HCC): Recheck A1c today. Doing well on medications, Metformin XR 750 mg once daily needing to be refilled. Foot exam today. Follow up in 3 months per patient preference.   - HgB A1c - HM Diabetes Foot Exam - metFORMIN (GLUCOPHAGE XR) 750 MG 24 hr tablet; Take 1 tablet (750 mg total) by mouth daily with breakfast.  Dispense: 90 tablet; Refill: 3  2. Lipid screening: Fasting lipid panel today.   - Lipid Profile   Follow-up: No follow-ups on file.    Lenell Lama, DO 

## 2022-05-09 ENCOUNTER — Ambulatory Visit: Payer: BC Managed Care – PPO | Admitting: Internal Medicine

## 2022-05-09 ENCOUNTER — Encounter: Payer: Self-pay | Admitting: Internal Medicine

## 2022-05-09 VITALS — BP 142/80 | HR 106 | Temp 97.9°F | Resp 16 | Ht 66.0 in | Wt 234.0 lb

## 2022-05-09 DIAGNOSIS — E782 Mixed hyperlipidemia: Secondary | ICD-10-CM

## 2022-05-09 DIAGNOSIS — Z7984 Long term (current) use of oral hypoglycemic drugs: Secondary | ICD-10-CM | POA: Diagnosis not present

## 2022-05-09 DIAGNOSIS — E1165 Type 2 diabetes mellitus with hyperglycemia: Secondary | ICD-10-CM | POA: Diagnosis not present

## 2022-05-09 MED ORDER — METFORMIN HCL ER 750 MG PO TB24: 750.0000 mg | ORAL_TABLET | Freq: Two times a day (BID) | ORAL | 1 refills | Status: DC

## 2022-05-09 MED ORDER — ROSUVASTATIN CALCIUM 10 MG PO TABS: 10.0000 mg | ORAL_TABLET | Freq: Every day | ORAL | 1 refills | Status: DC

## 2022-05-10 DIAGNOSIS — E1165 Type 2 diabetes mellitus with hyperglycemia: Secondary | ICD-10-CM | POA: Diagnosis not present

## 2022-05-10 DIAGNOSIS — E782 Mixed hyperlipidemia: Secondary | ICD-10-CM | POA: Diagnosis not present

## 2022-05-11 LAB — CBC WITH DIFFERENTIAL/PLATELET
Absolute Monocytes: 686 cells/uL (ref 200–950)
Basophils Absolute: 37 cells/uL (ref 0–200)
Basophils Relative: 0.5 %
Eosinophils Absolute: 183 cells/uL (ref 15–500)
Eosinophils Relative: 2.5 %
HCT: 40.6 % (ref 38.5–50.0)
Hemoglobin: 13.3 g/dL (ref 13.2–17.1)
Lymphs Abs: 2402 cells/uL (ref 850–3900)
MCH: 28.8 pg (ref 27.0–33.0)
MCHC: 32.8 g/dL (ref 32.0–36.0)
MCV: 87.9 fL (ref 80.0–100.0)
MPV: 12.4 fL (ref 7.5–12.5)
Monocytes Relative: 9.4 %
Neutro Abs: 3993 cells/uL (ref 1500–7800)
Neutrophils Relative %: 54.7 %
Platelets: 171 10*3/uL (ref 140–400)
RBC: 4.62 10*6/uL (ref 4.20–5.80)
RDW: 10.9 % — ABNORMAL LOW (ref 11.0–15.0)
Total Lymphocyte: 32.9 %
WBC: 7.3 10*3/uL (ref 3.8–10.8)

## 2022-05-11 LAB — COMPLETE METABOLIC PANEL WITH GFR
AG Ratio: 1.5 (calc) (ref 1.0–2.5)
ALT: 20 U/L (ref 9–46)
AST: 15 U/L (ref 10–35)
Albumin: 4.1 g/dL (ref 3.6–5.1)
Alkaline phosphatase (APISO): 66 U/L (ref 35–144)
BUN: 18 mg/dL (ref 7–25)
CO2: 25 mmol/L (ref 20–32)
Calcium: 9.9 mg/dL (ref 8.6–10.3)
Chloride: 104 mmol/L (ref 98–110)
Creat: 0.87 mg/dL (ref 0.70–1.30)
Globulin: 2.8 g/dL (calc) (ref 1.9–3.7)
Glucose, Bld: 230 mg/dL — ABNORMAL HIGH (ref 65–99)
Potassium: 5.7 mmol/L — ABNORMAL HIGH (ref 3.5–5.3)
Sodium: 138 mmol/L (ref 135–146)
Total Bilirubin: 0.5 mg/dL (ref 0.2–1.2)
Total Protein: 6.9 g/dL (ref 6.1–8.1)
eGFR: 104 mL/min/{1.73_m2} (ref >=60)

## 2022-05-11 LAB — HEMOGLOBIN A1C
Hgb A1c MFr Bld: 9.6 % of total Hgb — ABNORMAL HIGH (ref <5.7)
Mean Plasma Glucose: 229 mg/dL
eAG (mmol/L): 12.7 mmol/L

## 2022-05-12 ENCOUNTER — Other Ambulatory Visit: Payer: Self-pay | Admitting: Internal Medicine

## 2022-05-12 DIAGNOSIS — E1165 Type 2 diabetes mellitus with hyperglycemia: Secondary | ICD-10-CM

## 2022-05-12 MED ORDER — EMPAGLIFLOZIN 25 MG PO TABS
25.0000 mg | ORAL_TABLET | Freq: Every day | ORAL | 2 refills | Status: DC
Start: 2022-05-12 — End: 2022-08-15

## 2022-06-30 ENCOUNTER — Ambulatory Visit: Payer: Self-pay

## 2022-06-30 NOTE — Telephone Encounter (Signed)
Called pt to inform him Dr. Caralee Ates is out of the office this week and we will contact him once PCP reviews it sometimes next week. Pt verbalized understanding

## 2022-06-30 NOTE — Telephone Encounter (Signed)
Message from Glean Salen sent at 06/30/2022 11:29 AM EDT  Summary: med ?   Pt called in wants to know if he can get jaudiance taken as injection instead of pill form.         Chief Complaint: Pt wanting Jardiance SQ rather than po Disposition: [] ED /[] Urgent Care (no appt availability in office) / [] Appointment(In office/virtual)/ []  [facility]/ [] Home Care/ [] Refused Recommended Disposition /[] [facility]/ [x]  Follow-up with PCP Additional Notes: -  Reason for Disposition  [1] Caller has NON-URGENT medicine question about med that PCP prescribed AND [2] triager unable to answer question  Answer Assessment - Initial Assessment Questions 1. DRUG NAME: "What medicine do you need to have refilled?"     Would like Jardiance SQ rather than po  Protocols used: Medication Refill and Renewal Call-A-AH

## 2022-06-30 NOTE — Telephone Encounter (Signed)
Patient called, left VM to return the call to the office to speak to the NT.   Summary: med ?   Pt called in wants to know if he can get jaudiance taken as injection instead of pill form.

## 2022-07-04 NOTE — Telephone Encounter (Signed)
Pt verbalized understanding, he will restart Jardiance and will let us know within in 2 weeks if his BM are still the same, per pt too uncomfortable going too frequent.

## 2022-07-04 NOTE — Telephone Encounter (Signed)
Pt verbalized understanding he states he is having more frequent BM as you had gave him heads up on previous visit. He stopped Jardiance estimated 06/12/2022. He has not picked up any other refills remaining.

## 2022-08-10 NOTE — Progress Notes (Signed)
Established Patient Office Visit  Subjective:  Patient ID: Alvin Green, male    DOB: [birth year]  Age: 52 y.o. MRN: [id number]  CC:  Chief Complaint  Patient presents with   Follow-up   Diabetes    HPI [name] presents for follow up on chronic medical conditions. He did have a recent fall while helping more furniture for his mother, states his pants were loose and he slipped on them which caused the fall. However he did twist his knee about 2 weeks ago while mowing the grass, felt a pop at the time and has had medial knee pain ever since. Slowly improving but difficult to bear weight on the joint. Taking Tylenol. Pain located medially, does not radiate. Joint feels unstable, sometimes clicks.   Diabetes, Type 2: -Last A1c 9.6% 5/24 -Diagnosed 2019  -Medications: Metformin ER 750 mg BID, started on Jardiance 25 mg at LOV which is is doing well with, no side effects and is compliant daily. -Diet: trying to eat more fruit -Exercise: Not currently  -Eye exam: UTD 1/24 with [facility]  -Foot exam: UTD 1/24 -Microalbumin: Due -Statin: Yes -PNA vaccine: UTD -Denies symptoms of hypoglycemia, polyuria, polydipsia, numbness extremities, foot ulcers/trauma.   HLD/elevated ASCVD risk: -Medications: Crestor 10 mg  -Compliant with medication and reports no side effects -Last lipid panel: Lipid Panel     Component Value Date/Time   CHOL 154 01/24/2022 0846   TRIG 73 01/24/2022 0846   HDL 65 01/24/2022 0846   CHOLHDL 2.4 01/24/2022 0846   VLDL 18 05/30/2016 1052   LDLCALC 74 01/24/2022 0846    The 10-year ASCVD risk score (Arnett DK, et al., 2019) is: 15.2%   Values used to calculate the score:     Age: 27 years     Sex: Male     Is Non-Hispanic African American: Yes     Diabetic: Yes     Tobacco smoker: Yes     Systolic Blood Pressure: 126 mmHg     Is BP treated: No     HDL Cholesterol: 65 mg/dL     Total Cholesterol: 154 mg/dL  OSA/Obesity: -CPAP  nightly, working on lifestyle change  Health Maintenance: -Blood work UTD -Colon cancer screening: Cologuard 4/23 negative    Past Medical History:  Diagnosis Date   Acquired trigger finger 08/02/2017   Decreased libido    Heart murmur    Obesity    Sleep apnea in adult    Sprain of metacarpophalangeal joint 08/02/2017   Type 2 diabetes mellitus (HCC) 04/28/2016    Past Surgical History:  Procedure Laterality Date   CARPAL TUNNEL RELEASE     CARPAL TUNNEL RELEASE Right    approximately 10 years ago    Family History  Problem Relation Age of Onset   Hypertension Mother    Sleep disorder Father    Hypertension Brother     Social History   Socioeconomic History   Marital status: Married    Spouse name: Alvin Green   Number of children: 2   Years of education: Not on file   Highest education level: Some college, no degree  Occupational History   Not on file  Tobacco Use   Smoking status: Every Day    Current packs/day: 0.50    Average packs/day: 0.5 packs/day for 24.0 years (12.0 ttl pk-yrs)    Types: Cigarettes   Smokeless tobacco: Never   Tobacco comments:    most of years less than half a pack  per day   Vaping Use   Vaping status: Never Used  Substance and Sexual Activity   Alcohol use: Yes    Alcohol/week: 0.0 standard drinks of alcohol    Comment: occasionally   Drug use: No   Sexual activity: Yes    Partners: Female    Birth control/protection: None  Other Topics Concern   Not on file  Social History Narrative   Works at [employer]   Social Determinants of [employer]: Low Risk  (03/04/2018)   Overall Financial Resource Strain (CARDIA)    Difficulty of Paying Living Expenses: Not hard at all  Food Insecurity: No Food Insecurity (03/04/2018)   Hunger Vital Sign    Worried About Running Out of Food in the Last Year: Never true    Ran Out of Food in the Last Year: Never true  Transportation Needs: No Transportation Needs (03/04/2018)    PRAPARE - Administrator, Civil Service (Medical): No    Lack of Transportation (Non-Medical): No  Physical Activity: Inactive (03/04/2018)   Exercise Vital Sign    Days of Exercise per Week: 0 days    Minutes of Exercise per Session: 0 min  Stress: Stress Concern Present (03/04/2018)   [employer] of Occupational Health - Occupational Stress Questionnaire    Feeling of Stress : To some extent  Social Connections: Somewhat Isolated (03/04/2018)   Social Connection and Isolation Panel [NHANES]    Frequency of Communication with Friends and Family: More than three times a week    Frequency of Social Gatherings with Friends and Family: Never    Attends Religious Services: Never    Database administrator or Organizations: No    Attends Banker Meetings: Never    Marital Status: Married  Catering manager Violence: Not At Risk (03/04/2018)   Humiliation, Afraid, Rape, and Kick questionnaire    Fear of Current or Ex-Partner: No    Emotionally Abused: No    Physically Abused: No    Sexually Abused: No    Outpatient Medications Prior to Visit  Medication Sig Dispense Refill   aspirin EC 81 MG tablet Take 1 tablet (81 mg total) by mouth daily. 90 tablet 0   empagliflozin (JARDIANCE) 25 MG TABS tablet Take 1 tablet (25 mg total) by mouth daily before breakfast. 30 tablet 2   metFORMIN (GLUCOPHAGE XR) 750 MG 24 hr tablet Take 1 tablet (750 mg total) by mouth 2 (two) times daily with a meal. 90 tablet 1   rosuvastatin (CRESTOR) 10 MG tablet Take 1 tablet (10 mg total) by mouth daily. 90 tablet 1   No facility-administered medications prior to visit.    No Known Allergies  ROS Review of Systems  Constitutional:  Negative for chills and fever.  Eyes:  Negative for visual disturbance.  Respiratory:  Negative for cough.   Cardiovascular:  Negative for chest pain.  Gastrointestinal:  Negative for abdominal pain and diarrhea.  Genitourinary:  Negative for dysuria and  frequency.  Musculoskeletal:  Positive for arthralgias and joint swelling.      Objective:    Physical Exam Constitutional:      Appearance: Normal appearance.  HENT:     Head: Normocephalic and atraumatic.  Eyes:     Conjunctiva/sclera: Conjunctivae normal.  Cardiovascular:     Rate and Rhythm: Normal rate and regular rhythm.  Pulmonary:     Effort: Pulmonary effort is normal.     Breath sounds: Normal  breath sounds.  Musculoskeletal:     Right knee: Swelling and bony tenderness present. Decreased range of motion. Tenderness present over the medial joint line. No LCL laxity, MCL laxity, ACL laxity or PCL laxity.     Instability Tests: Anterior drawer test negative. Posterior drawer test negative. Medial McMurray test negative.  Skin:    General: Skin is warm and dry.  Neurological:     General: No focal deficit present.     Mental Status: He is alert. Mental status is at baseline.  Psychiatric:        Mood and Affect: Mood normal.        Behavior: Behavior normal.     BP 126/80   Pulse 97   Temp 98.4 F (36.9 C)   Resp 18   Ht 5\' 6"  (1.676 m)   Wt 230 lb 4.8 oz (104.5 kg)   SpO2 98%   BMI 37.17 kg/m  Wt Readings from Last 3 Encounters:  08/15/22 230 lb 4.8 oz (104.5 kg)  05/09/22 234 lb (106.1 kg)  01/24/22 234 lb 1.6 oz (106.2 kg)     Health Maintenance Due  Topic Date Due   OPHTHALMOLOGY EXAM  05/02/2021   Diabetic kidney evaluation - Urine ACR  07/13/2022   INFLUENZA VACCINE  08/03/2022    There are no preventive care reminders to display for this patient.  Lab Results  Component Value Date   TSH 2.03 04/28/2016   Lab Results  Component Value Date   WBC 7.3 05/10/2022   HGB 13.3 05/10/2022   HCT 40.6 05/10/2022   MCV 87.9 05/10/2022   PLT 171 05/10/2022   Lab Results  Component Value Date   NA 138 05/10/2022   K 5.7 (H) 05/10/2022   CO2 25 05/10/2022   GLUCOSE 230 (H) 05/10/2022   BUN 18 05/10/2022   CREATININE 0.87 05/10/2022    BILITOT 0.5 05/10/2022   ALKPHOS 91 04/28/2016   AST 15 05/10/2022   ALT 20 05/10/2022   PROT 6.9 05/10/2022   ALBUMIN 4.2 04/28/2016   CALCIUM 9.9 05/10/2022   EGFR 104 05/10/2022   Lab Results  Component Value Date   CHOL 154 01/24/2022   Lab Results  Component Value Date   HDL 65 01/24/2022   Lab Results  Component Value Date   LDLCALC 74 01/24/2022   Lab Results  Component Value Date   TRIG 73 01/24/2022   Lab Results  Component Value Date   CHOLHDL 2.4 01/24/2022   Lab Results  Component Value Date   HGBA1C 9.0 (A) 08/15/2022      Assessment & Plan:   1. Type 2 diabetes mellitus with hyperglycemia, without Geier-term current use of insulin (HCC): A1c improved to 9.0% but still uncontrolled.  Patient currently on metformin extended release 750 mg twice daily is now on Jardiance 25 mg which she is doing well with.  This will be refilled.  Will order Mounjaro 2.5 mg weekly.  Discussed how medication works and potential side effects.  Patient will follow-up in 1 month to see how he is doing on this new medication.  Urine microalbumin ordered today as well.  - POCT HgB A1C - Urine Microalbumin w/creat. ratio - tirzepatide (MOUNJARO) 2.5 MG/0.5ML Pen; Inject 2.5 mg into the skin once a week.  Dispense: 2 mL; Refill: 0 - empagliflozin (JARDIANCE) 25 MG TABS tablet; Take 1 tablet (25 mg total) by mouth daily before breakfast.  Dispense: 90 tablet; Refill: 1  2. Right medial knee  pain: Based on history and physical exam, I am concerned for potential medial ligament or medial meniscal injury.  Will send for x-ray today but most likely will require an MRI.  Patient can barely bear weight on the joint will prescribe naproxen to take as needed for pain with food.  - naproxen (NAPROSYN) 500 MG tablet; Take 1 tablet (500 mg total) by mouth daily as needed for moderate pain.  Dispense: 20 tablet; Refill: 0 - DG Knee Complete 4 Views Right; Future   Follow-up: Return in about 4  weeks (around 09/12/2022).    Margarita Mail, DO

## 2022-08-14 ENCOUNTER — Telehealth: Payer: Self-pay | Admitting: Emergency Medicine

## 2022-08-14 NOTE — Telephone Encounter (Signed)
Patient wife called and stated that husband has appointment tomorrow and has fallen twice. He is complaining of knee pain and they are giving out on him

## 2022-08-15 ENCOUNTER — Ambulatory Visit
Admission: RE | Admit: 2022-08-15 | Discharge: 2022-08-15 | Disposition: A | Discharge status: Home / Self Care | Payer: BC Managed Care – PPO | Source: Ambulatory Visit | Attending: Internal Medicine | Admitting: Internal Medicine

## 2022-08-15 ENCOUNTER — Encounter: Payer: Self-pay | Admitting: Internal Medicine

## 2022-08-15 ENCOUNTER — Ambulatory Visit: Payer: BC Managed Care – PPO | Admitting: Internal Medicine

## 2022-08-15 VITALS — BP 126/80 | HR 97 | Temp 98.4°F | Resp 18 | Ht 66.0 in | Wt 230.3 lb

## 2022-08-15 DIAGNOSIS — M25561 Pain in right knee: Secondary | ICD-10-CM

## 2022-08-15 DIAGNOSIS — Z7984 Long term (current) use of oral hypoglycemic drugs: Secondary | ICD-10-CM

## 2022-08-15 DIAGNOSIS — E1165 Type 2 diabetes mellitus with hyperglycemia: Secondary | ICD-10-CM

## 2022-08-15 DIAGNOSIS — M1711 Unilateral primary osteoarthritis, right knee: Secondary | ICD-10-CM | POA: Diagnosis not present

## 2022-08-15 DIAGNOSIS — M7651 Patellar tendinitis, right knee: Secondary | ICD-10-CM | POA: Diagnosis not present

## 2022-08-15 LAB — POCT GLYCOSYLATED HEMOGLOBIN (HGB A1C): Hemoglobin A1C: 9 % — AB (ref 4.0–5.6)

## 2022-08-15 MED ORDER — NAPROXEN 500 MG PO TABS
500.0000 mg | ORAL_TABLET | Freq: Every day | ORAL | 0 refills | Status: DC | PRN
Start: 2022-08-15 — End: 2022-12-12

## 2022-08-15 MED ORDER — TIRZEPATIDE 2.5 MG/0.5ML ~~LOC~~ SOAJ
2.5000 mg | SUBCUTANEOUS | 0 refills | Status: DC
Start: 2022-08-15 — End: 2022-09-12

## 2022-08-15 MED ORDER — EMPAGLIFLOZIN 25 MG PO TABS
25.0000 mg | ORAL_TABLET | Freq: Every day | ORAL | 1 refills | Status: DC
Start: 2022-08-15 — End: 2022-12-12

## 2022-08-16 ENCOUNTER — Telehealth: Payer: Self-pay | Admitting: Internal Medicine

## 2022-08-16 NOTE — Telephone Encounter (Signed)
Pt notified we have not received yet

## 2022-08-16 NOTE — Telephone Encounter (Signed)
Copied from CRM [id number]. Topic: General - Inquiry >> Aug 16, 2022  2:13 PM De Blanch wrote: Reason for CRM: Pt calling to f/u on recent x-ray results. Please advise.

## 2022-08-21 NOTE — Telephone Encounter (Signed)
Please review xray

## 2022-08-31 ENCOUNTER — Encounter: Payer: Self-pay | Admitting: Family Medicine

## 2022-08-31 ENCOUNTER — Ambulatory Visit (INDEPENDENT_AMBULATORY_CARE_PROVIDER_SITE_OTHER): Payer: BC Managed Care – PPO | Admitting: Family Medicine

## 2022-08-31 VITALS — BP 134/88 | HR 90 | Temp 98.6°F | Resp 16 | Ht 66.0 in | Wt 232.3 lb

## 2022-08-31 DIAGNOSIS — J069 Acute upper respiratory infection, unspecified: Secondary | ICD-10-CM | POA: Diagnosis not present

## 2022-08-31 DIAGNOSIS — R059 Cough, unspecified: Secondary | ICD-10-CM

## 2022-08-31 DIAGNOSIS — R6883 Chills (without fever): Secondary | ICD-10-CM

## 2022-08-31 DIAGNOSIS — F17210 Nicotine dependence, cigarettes, uncomplicated: Secondary | ICD-10-CM

## 2022-08-31 DIAGNOSIS — U071 COVID-19: Secondary | ICD-10-CM | POA: Diagnosis not present

## 2022-08-31 MED ORDER — NIRMATRELVIR/RITONAVIR (PAXLOVID)TABLET: 3.0000 | ORAL_TABLET | Freq: Two times a day (BID) | ORAL | 0 refills | Status: AC

## 2022-08-31 NOTE — Progress Notes (Signed)
Patient ID: Alvin Green, male    DOB: Aug 31, 1970, 53 y.o.   MRN: [id number]  PCP: Margarita Mail, DO  Chief Complaint  Patient presents with   Covid testing    Pt preference   Covid Positive    Last night at home   Nasal Congestion    Sx since Monday   Cough   Chills    Subjective:   Alvin Green is a 52 y.o. male, presents to clinic with CC of the following:  HPI  Positive for covid, sx started Monday 3 d ago, congestion, body ache, productive cough, chills Subjective fever last night and this am  No sore throat, CP, SOB, wheeze Hx of poorly controlled DM, obesity, OSA, current smoker He is interested in paxlovid   Patient Active Problem List   Diagnosis Date Noted   Onychomycosis 09/20/2020   Tobacco abuse 04/05/2018   Obstructive sleep apnea 07/20/2017   Vitamin D deficiency 03/15/2017   Type 2 diabetes mellitus (HCC) 04/28/2016   Class 3 severe obesity without serious comorbidity with body mass index (BMI) of 40.0 to 44.9 in adult (HCC) 09/28/2014      Current Outpatient Medications:    aspirin EC 81 MG tablet, Take 1 tablet (81 mg total) by mouth daily., Disp: 90 tablet, Rfl: 0   empagliflozin (JARDIANCE) 25 MG TABS tablet, Take 1 tablet (25 mg total) by mouth daily before breakfast., Disp: 90 tablet, Rfl: 1   metFORMIN (GLUCOPHAGE XR) 750 MG 24 hr tablet, Take 1 tablet (750 mg total) by mouth 2 (two) times daily with a meal., Disp: 90 tablet, Rfl: 1   naproxen (NAPROSYN) 500 MG tablet, Take 1 tablet (500 mg total) by mouth daily as needed for moderate pain., Disp: 20 tablet, Rfl: 0   rosuvastatin (CRESTOR) 10 MG tablet, Take 1 tablet (10 mg total) by mouth daily., Disp: 90 tablet, Rfl: 1   tirzepatide (MOUNJARO) 2.5 MG/0.5ML Pen, Inject 2.5 mg into the skin once a week., Disp: 2 mL, Rfl: 0   No Known Allergies   Social History   Tobacco Use   Smoking status: Every Day    Current packs/day: 0.50    Average packs/day: 0.5  packs/day for 24.0 years (12.0 ttl pk-yrs)    Types: Cigarettes   Smokeless tobacco: Never   Tobacco comments:    most of years less than half a pack per day   Vaping Use   Vaping status: Never Used  Substance Use Topics   Alcohol use: Yes    Alcohol/week: 0.0 standard drinks of alcohol    Comment: occasionally   Drug use: No      Chart Review Today:   Review of Systems  Constitutional: Negative.   HENT: Negative.    Eyes: Negative.   Respiratory: Negative.    Cardiovascular: Negative.   Gastrointestinal: Negative.   Endocrine: Negative.   Genitourinary: Negative.   Musculoskeletal: Negative.   Skin: Negative.   Allergic/Immunologic: Negative.   Neurological: Negative.   Hematological: Negative.   Psychiatric/Behavioral: Negative.    All other systems reviewed and are negative.      Objective:   Vitals:   08/31/22 1104  BP: 134/88  Pulse: 90  Resp: 16  Temp: 98.6 F (37 C)  TempSrc: Temporal  SpO2: 97%  Weight: 232 lb 4.8 oz (105.4 kg)  Height: 5\' 6"  (1.676 m)    Body mass index is 37.49 kg/m.  Physical Exam Vitals and nursing note reviewed.  Constitutional:  General: He is not in acute distress.    Appearance: Normal appearance. He is well-developed. He is obese. He is not ill-appearing, toxic-appearing or diaphoretic.  HENT:     Head: Normocephalic and atraumatic.     Right Ear: Tympanic membrane, ear canal and external ear normal.     Left Ear: Tympanic membrane, ear canal and external ear normal.     Nose: Congestion and rhinorrhea present.     Mouth/Throat:     Mouth: Mucous membranes are moist.     Pharynx: Oropharynx is clear. No oropharyngeal exudate or posterior oropharyngeal erythema.  Eyes:     General: No scleral icterus.       Right eye: No discharge.        Left eye: No discharge.     Conjunctiva/sclera: Conjunctivae normal.  Neck:     Trachea: No tracheal deviation.  Cardiovascular:     Rate and Rhythm: Normal rate and  regular rhythm.     Pulses: Normal pulses.     Heart sounds: Normal heart sounds. No murmur heard.    No friction rub. No gallop.  Pulmonary:     Effort: Pulmonary effort is normal. No respiratory distress.     Breath sounds: Normal breath sounds. No stridor. No wheezing, rhonchi or rales.  Abdominal:     General: Bowel sounds are normal.     Palpations: Abdomen is soft.  Skin:    General: Skin is warm and dry.     Findings: No rash.  Neurological:     Mental Status: He is alert. Mental status is at baseline.     Motor: No abnormal muscle tone.     Coordination: Coordination normal.  Psychiatric:        Mood and Affect: Mood normal.        Behavior: Behavior normal.      Results for orders placed or performed in visit on 08/15/22  HM DIABETES EYE EXAM  Result Value Ref Range   HM Diabetic Eye Exam No Retinopathy No Retinopathy       Assessment & Plan:   1. Cough, unspecified type Lungs CTA on exam, no wheeze, rales, rhonchi or concerning sx Monitor mildly productive cough Encouraged mucinex, OTC cough meds, and close f/up if any new wheeze SOB or pleuritic CP - Novel Coronavirus, NAA ([facility])  2. Chills Likely due to covid URI Can use OTC analgesics and antipyretics as needed for subjective fever symptoms and bodyaches - Novel Coronavirus, NAA ([facility])  3. Upper respiratory tract infection due to COVID-19 virus The patient has a lot of nasal congestion swelling and discharge without any sinus tenderness to palpation, and his lungs are clear and he is otherwise well-appearing with stable vital signs Encouraged him to rest at home, push clear fluids he can start Paxlovid and use other over-the-counter medications to manage symptoms including nasal sprays, cough medications, Mucinex, Tylenol, ibuprofen etc. recommendations on current COVID "isolation" and return to work were reviewed - nirmatrelvir/ritonavir (PAXLOVID) 20 x 150 MG & 10 x 100MG  TABS; Take 3 tablets by  mouth 2 (two) times daily for 5 days. Take nirmatrelvir (150 mg) two tablets twice daily for 5 days and ritonavir (100 mg) one tablet twice daily for 5 days. Patient GFR is 105  Dispense: 30 tablet; Refill: 0   Did encourage him to contact us if he needs his work note extended currently he is planning to return to work on Labor Day next Monday on September 2 and a  work note was given today to reflect that He was encouraged to return to work when fever free and having improving symptoms but was also recommended to wear a mask for 5 days to limit potential spread to other individuals  We did review his renal function the benefits and possible side effects of Paxlovid and he did want to get and start Paxlovid, he is also in the 5-day window since symptom onset.  All questions asked and answered, follow-up as needed   Danelle Berry, PA-C 08/31/22 11:23 AM

## 2022-08-31 NOTE — Patient Instructions (Signed)
Recommend staying out of work and isolating until fever free for more than 24 hours and respiratory symptoms improving.  Wear a mask for an additional 5 days to try to prevent any spread to anyone else. Please send [place] a message if you need your work note changed  For congestion and coughing recommend trying mucinex, delsym, sudafed and nasal sprays to help manage symptoms and we would want to see you or have you rechecked right away if there is any worsening - especially if any trouble breathing or pain with breathing.

## 2022-09-01 LAB — NOVEL CORONAVIRUS, NAA: SARS-CoV-2, NAA: DETECTED — AB

## 2022-09-11 NOTE — Progress Notes (Unsigned)
Established Patient Office Visit  Subjective:  Patient ID: Alvin Green, male    DOB: 11-21-1970  Age: 52 y.o. MRN: [id number]  CC:  No chief complaint on file.   HPI Alvin Green presents for follow up on chronic medical conditions. He did have a recent fall while helping more furniture for his mother, states his pants were loose and he slipped on them which caused the fall. However he did twist his knee about 2 weeks ago while mowing the grass, felt a pop at the time and has had medial knee pain ever since. Slowly improving but difficult to bear weight on the joint. Taking Tylenol. Pain located medially, does not radiate. Joint feels unstable, sometimes clicks.   Diabetes, Type 2: -Last A1c 9.0% 8/24 -Diagnosed 2019  -Medications: Metformin ER 750 mg BID, Jardiance 25 mg, added Mounjaro 2.5 mg at LOV  -Diet: trying to eat more fruit -Exercise: Not currently  -Eye exam: UTD 1/24 with [facility]  -Foot exam: UTD 1/24 -Microalbumin: UTD 8/24 -Statin: Yes -PNA vaccine: UTD -Denies symptoms of hypoglycemia, polyuria, polydipsia, numbness extremities, foot ulcers/trauma.   HLD/elevated ASCVD risk: -Medications: Crestor 10 mg  -Compliant with medication and reports no side effects -Last lipid panel: Lipid Panel     Component Value Date/Time   CHOL 154 01/24/2022 0846   TRIG 73 01/24/2022 0846   HDL 65 01/24/2022 0846   CHOLHDL 2.4 01/24/2022 0846   VLDL 18 05/30/2016 1052   LDLCALC 74 01/24/2022 0846    The 10-year ASCVD risk score (Arnett DK, et al., 2019) is: 16.8%   Values used to calculate the score:     Age: 21 years     Sex: Male     Is Non-Hispanic African American: Yes     Diabetic: Yes     Tobacco smoker: Yes     Systolic Blood Pressure: 134 mmHg     Is BP treated: No     HDL Cholesterol: 65 mg/dL     Total Cholesterol: 154 mg/dL  OSA/Obesity: -CPAP nightly, working on lifestyle change  Health Maintenance: -Blood work UTD -Colon  cancer screening: Cologuard 4/23 negative    Past Medical History:  Diagnosis Date   Acquired trigger finger 08/02/2017   Decreased libido    Heart murmur    Obesity    Sleep apnea in adult    Sprain of metacarpophalangeal joint 08/02/2017   Type 2 diabetes mellitus (HCC) 04/28/2016    Past Surgical History:  Procedure Laterality Date   CARPAL TUNNEL RELEASE     CARPAL TUNNEL RELEASE Right    approximately 10 years ago    Family History  Problem Relation Age of Onset   Hypertension Mother    Sleep disorder Father    Hypertension Brother     Social History   Socioeconomic History   Marital status: Married    Spouse name: Bronson Ing   Number of children: 2   Years of education: Not on file   Highest education level: Some college, no degree  Occupational History   Not on file  Tobacco Use   Smoking status: Every Day    Current packs/day: 0.50    Average packs/day: 0.5 packs/day for 24.0 years (12.0 ttl pk-yrs)    Types: Cigarettes   Smokeless tobacco: Never   Tobacco comments:    most of years less than half a pack per day   Vaping Use   Vaping status: Never Used  Substance and Sexual Activity  Alcohol use: Yes    Alcohol/week: 0.0 standard drinks of alcohol    Comment: occasionally   Drug use: No   Sexual activity: Yes    Partners: Female    Birth control/protection: None  Other Topics Concern   Not on file  Social History Narrative   Works at [employer]   Social Determinants of [employer]: Low Risk  (03/04/2018)   Overall Financial Resource Strain (CARDIA)    Difficulty of Paying Living Expenses: Not hard at all  Food Insecurity: No Food Insecurity (03/04/2018)   Hunger Vital Sign    Worried About Running Out of Food in the Last Year: Never true    Ran Out of Food in the Last Year: Never true  Transportation Needs: No Transportation Needs (03/04/2018)   PRAPARE - Administrator, Civil Service (Medical): No    Lack of  Transportation (Non-Medical): No  Physical Activity: Inactive (03/04/2018)   Exercise Vital Sign    Days of Exercise per Week: 0 days    Minutes of Exercise per Session: 0 min  Stress: Stress Concern Present (03/04/2018)   [employer] of Occupational Health - Occupational Stress Questionnaire    Feeling of Stress : To some extent  Social Connections: Somewhat Isolated (03/04/2018)   Social Connection and Isolation Panel [NHANES]    Frequency of Communication with Friends and Family: More than three times a week    Frequency of Social Gatherings with Friends and Family: Never    Attends Religious Services: Never    Database administrator or Organizations: No    Attends Banker Meetings: Never    Marital Status: Married  Catering manager Violence: Not At Risk (03/04/2018)   Humiliation, Afraid, Rape, and Kick questionnaire    Fear of Current or Ex-Partner: No    Emotionally Abused: No    Physically Abused: No    Sexually Abused: No    Outpatient Medications Prior to Visit  Medication Sig Dispense Refill   aspirin EC 81 MG tablet Take 1 tablet (81 mg total) by mouth daily. 90 tablet 0   empagliflozin (JARDIANCE) 25 MG TABS tablet Take 1 tablet (25 mg total) by mouth daily before breakfast. 90 tablet 1   metFORMIN (GLUCOPHAGE XR) 750 MG 24 hr tablet Take 1 tablet (750 mg total) by mouth 2 (two) times daily with a meal. 90 tablet 1   naproxen (NAPROSYN) 500 MG tablet Take 1 tablet (500 mg total) by mouth daily as needed for moderate pain. 20 tablet 0   rosuvastatin (CRESTOR) 10 MG tablet Take 1 tablet (10 mg total) by mouth daily. 90 tablet 1   tirzepatide (MOUNJARO) 2.5 MG/0.5ML Pen Inject 2.5 mg into the skin once a week. 2 mL 0   No facility-administered medications prior to visit.    No Known Allergies  ROS Review of Systems  Constitutional:  Negative for chills and fever.  Eyes:  Negative for visual disturbance.  Respiratory:  Negative for cough.    Cardiovascular:  Negative for chest pain.  Gastrointestinal:  Negative for abdominal pain and diarrhea.  Genitourinary:  Negative for dysuria and frequency.  Musculoskeletal:  Positive for arthralgias and joint swelling.      Objective:    Physical Exam Constitutional:      Appearance: Normal appearance.  HENT:     Head: Normocephalic and atraumatic.  Eyes:     Conjunctiva/sclera: Conjunctivae normal.  Cardiovascular:     Rate and  Rhythm: Normal rate and regular rhythm.  Pulmonary:     Effort: Pulmonary effort is normal.     Breath sounds: Normal breath sounds.  Musculoskeletal:     Right knee: Swelling and bony tenderness present. Decreased range of motion. Tenderness present over the medial joint line. No LCL laxity, MCL laxity, ACL laxity or PCL laxity.     Instability Tests: Anterior drawer test negative. Posterior drawer test negative. Medial McMurray test negative.  Skin:    General: Skin is warm and dry.  Neurological:     General: No focal deficit present.     Mental Status: He is alert. Mental status is at baseline.  Psychiatric:        Mood and Affect: Mood normal.        Behavior: Behavior normal.     There were no vitals taken for this visit. Wt Readings from Last 3 Encounters:  08/31/22 232 lb 4.8 oz (105.4 kg)  08/15/22 230 lb 4.8 oz (104.5 kg)  05/09/22 234 lb (106.1 kg)     Health Maintenance Due  Topic Date Due   INFLUENZA VACCINE  08/03/2022   COVID-19 Vaccine (3 - 2023-24 season) 09/03/2022    There are no preventive care reminders to display for this patient.  Lab Results  Component Value Date   TSH 2.03 04/28/2016   Lab Results  Component Value Date   WBC 7.3 05/10/2022   HGB 13.3 05/10/2022   HCT 40.6 05/10/2022   MCV 87.9 05/10/2022   PLT 171 05/10/2022   Lab Results  Component Value Date   NA 138 05/10/2022   K 5.7 (H) 05/10/2022   CO2 25 05/10/2022   GLUCOSE 230 (H) 05/10/2022   BUN 18 05/10/2022   CREATININE 0.87  05/10/2022   BILITOT 0.5 05/10/2022   ALKPHOS 91 04/28/2016   AST 15 05/10/2022   ALT 20 05/10/2022   PROT 6.9 05/10/2022   ALBUMIN 4.2 04/28/2016   CALCIUM 9.9 05/10/2022   EGFR 104 05/10/2022   Lab Results  Component Value Date   CHOL 154 01/24/2022   Lab Results  Component Value Date   HDL 65 01/24/2022   Lab Results  Component Value Date   LDLCALC 74 01/24/2022   Lab Results  Component Value Date   TRIG 73 01/24/2022   Lab Results  Component Value Date   CHOLHDL 2.4 01/24/2022   Lab Results  Component Value Date   HGBA1C 9.0 (A) 08/15/2022      Assessment & Plan:   1. Type 2 diabetes mellitus with hyperglycemia, without Genrich-term current use of insulin (HCC): A1c improved to 9.0% but still uncontrolled.  Patient currently on metformin extended release 750 mg twice daily is now on Jardiance 25 mg which she is doing well with.  This will be refilled.  Will order Mounjaro 2.5 mg weekly.  Discussed how medication works and potential side effects.  Patient will follow-up in 1 month to see how he is doing on this new medication.  Urine microalbumin ordered today as well.  - POCT HgB A1C - Urine Microalbumin w/creat. ratio - tirzepatide (MOUNJARO) 2.5 MG/0.5ML Pen; Inject 2.5 mg into the skin once a week.  Dispense: 2 mL; Refill: 0 - empagliflozin (JARDIANCE) 25 MG TABS tablet; Take 1 tablet (25 mg total) by mouth daily before breakfast.  Dispense: 90 tablet; Refill: 1  2. Right medial knee pain: Based on history and physical exam, I am concerned for potential medial ligament or medial meniscal injury.  Will send for x-ray today but most likely will require an MRI.  Patient can barely bear weight on the joint will prescribe naproxen to take as needed for pain with food.  - naproxen (NAPROSYN) 500 MG tablet; Take 1 tablet (500 mg total) by mouth daily as needed for moderate pain.  Dispense: 20 tablet; Refill: 0 - DG Knee Complete 4 Views Right; Future   Follow-up: No  follow-ups on file.    Margarita Mail, DO

## 2022-09-12 ENCOUNTER — Encounter: Payer: Self-pay | Admitting: Internal Medicine

## 2022-09-12 ENCOUNTER — Ambulatory Visit: Payer: BC Managed Care – PPO | Admitting: Internal Medicine

## 2022-09-12 VITALS — BP 138/82 | HR 93 | Temp 97.8°F | Resp 18 | Ht 66.0 in | Wt 231.5 lb

## 2022-09-12 DIAGNOSIS — Z7984 Long term (current) use of oral hypoglycemic drugs: Secondary | ICD-10-CM | POA: Diagnosis not present

## 2022-09-12 DIAGNOSIS — E1165 Type 2 diabetes mellitus with hyperglycemia: Secondary | ICD-10-CM | POA: Diagnosis not present

## 2022-09-12 DIAGNOSIS — M1711 Unilateral primary osteoarthritis, right knee: Secondary | ICD-10-CM

## 2022-09-12 MED ORDER — TIRZEPATIDE 5 MG/0.5ML ~~LOC~~ SOAJ: 5.0000 mg | SUBCUTANEOUS | 1 refills | Status: DC

## 2022-10-16 DIAGNOSIS — M25511 Pain in right shoulder: Secondary | ICD-10-CM | POA: Diagnosis not present

## 2022-10-16 DIAGNOSIS — Z79899 Other long term (current) drug therapy: Secondary | ICD-10-CM | POA: Diagnosis not present

## 2022-10-16 DIAGNOSIS — R0789 Other chest pain: Secondary | ICD-10-CM | POA: Diagnosis not present

## 2022-10-16 DIAGNOSIS — R079 Chest pain, unspecified: Secondary | ICD-10-CM | POA: Diagnosis not present

## 2022-10-16 LAB — COMPREHENSIVE METABOLIC PANEL WITH GFR: eGFR: 90

## 2022-10-17 DIAGNOSIS — R079 Chest pain, unspecified: Secondary | ICD-10-CM | POA: Diagnosis not present

## 2022-10-18 NOTE — ED Provider Notes (Signed)
 [facility] Emergency Department Provider Note     ED Clinical Impression   Final diagnoses:  Chest pain, unspecified type (Primary)    Presenting History and MDM   HPI  October 18, 2022 5:57 AM  Alvin Green is a 52 y.o. male who  has no past medical history on file. presenting for evaluation of right-sided chest and shoulder pain.  He has been doing more at work and was doing a lot of cleaning and using his right arm.  He does have a chronic cough that he attributes to COVID and smoking that has been going on for over a month.  No fever or systemic symptoms.  No palpitations or radiation of the pain..    Initial impression and pertinent physical exam:   BP 146/76   Pulse 60   Temp 37.3 C (99.2 F) (Oral)   Resp 18   Ht 162.6 cm (5' 4)   Wt (!) 104.3 kg (230 lb)   SpO2 100%   BMI 39.48 kg/m   On initial evaluation, VS stable.  Tender to palpation to right upper anterior chest.  Right shoulder movement exacerbates the pain.  No actual tenderness to shoulder or clavicle.  Distal pulses and sensation intact.  Lung sounds clear.  No M/R/G  MDM:  Most concern for MSK overuse injury including pectoral strain versus costochondritis versus rotator cuff pathology.  Will evaluate for ACS.  Strain consistent with PE as well as reassuring vitals.  Chest x-ray without pathology.  Patient given Toradol  and Robaxin with improvement in symptoms.  Plan for discharge with return precautions and instructions for PCP follow-up.     Diagnostic Orders:  Orders Placed This Encounter  Procedures  . XR Chest 2 views  . CBC w/ Differential  . Comprehensive Metabolic Panel  . PT-INR  . hsTroponin I (serial 0-2-6H w/ delta)  . hsTroponin I - 2 Hour  . ECG 12 Lead  . ECG 12 Lead  . ECG 12 Lead    Medications: Medications  ketorolac  (TORADOL ) injection 15 mg (15 mg Intravenous Given 10/17/22 0338)  methocarbamol (ROBAXIN) tablet 500 mg (500 mg Oral Given 10/17/22 9661)     Procedures/Critical Care:  Procedures   Additional Medical Decision Making          I have reviewed the vital signs and the nursing notes. Labs and radiology results that were available during my care of the patient were independently reviewed by me and considered in my medical decision making.  I independently visualized the EKG tracing if performed I independently visualized the radiology images if performed I reviewed the patient's prior medical records if available. Additional history obtained from family if available  Other History   CHIEF COMPLAINT:  Chief Complaint  Patient presents with  . Chest Pain    PAST MEDICAL HISTORY/PAST SURGICAL HISTORY:  No past medical history on file.  No past surgical history on file.  MEDICATIONS:  No current facility-administered medications for this encounter.  Current Outpatient Medications:  .  ibuprofen  (MOTRIN ) 600 MG tablet, Take 1 tablet (600 mg total) by mouth every six (6) hours as needed for pain., Disp: 30 tablet, Rfl: 0 .  methocarbamol (ROBAXIN) 500 MG tablet, Take 1 tablet (500 mg total) by mouth two (2) times a day for 10 days., Disp: 20 tablet, Rfl: 0  ALLERGIES:  Patient has no allergy information on record.  SOCIAL HISTORY:  Social History   Tobacco Use  . Smoking status: Not on  file  . Smokeless tobacco: Not on file  Substance Use Topics  . Alcohol use: Not on file    FAMILY HISTORY: No family history on file.    Review of Systems  A 10 point review of systems was performed and is negative other than positive elements noted in HPI   Physical Exam  Physical Exam Vitals and nursing note reviewed.  Constitutional:      General: He is not in acute distress.    Appearance: Normal appearance.  HENT:     Head: Normocephalic and atraumatic.     Right Ear: External ear normal.     Left Ear: External ear normal.     Nose: No congestion.     Mouth/Throat:     Mouth: Mucous membranes are  moist.  Eyes:     Conjunctiva/sclera: Conjunctivae normal.  Cardiovascular:     Rate and Rhythm: Normal rate and regular rhythm.     Heart sounds: No murmur heard.    No friction rub. No gallop.  Pulmonary:     Effort: Pulmonary effort is normal. No respiratory distress.     Breath sounds: Normal breath sounds.  Chest:     Chest wall: Tenderness present.    Abdominal:     General: There is no distension.  Musculoskeletal:        General: No swelling or deformity. Normal range of motion.     Cervical back: Normal range of motion.  Skin:    General: Skin is warm and dry.  Neurological:     General: No focal deficit present.     Mental Status: He is alert and oriented to person, place, and time. Mental status is at baseline.  Psychiatric:        Mood and Affect: Mood normal.        Behavior: Behavior normal.     Radiology   XR Chest 2 views  Final Result  No focal pulmonary opacities.      Labs   Labs Reviewed  COMPREHENSIVE METABOLIC PANEL - Abnormal; Notable for the following components:      Result Value   Chloride 110 (*)    Anion Gap 4 (*)    Glucose 210 (*)    All other components within normal limits  CBC W/ AUTO DIFF - Abnormal; Notable for the following components:   Absolute Monocytes 1.0 (*)    All other components within normal limits  HIGH SENSITIVITY TROPONIN I - SERIAL - Normal  HIGH SENSITIVITY TROPONIN I - 2 HOUR SERIAL - Normal  CBC W/ DIFFERENTIAL   Narrative:    The following orders were created for panel order CBC w/ Differential.                 Procedure                               Abnormality         Status                                    ---------                               -----------         ------  CBC w/ Differential[[id number]]         Abnormal            Final result                                               Please view results for these tests on the individual orders.  PROTIME-INR     Please note- This chart has been created using [employer]. Chart creation errors have been sought, but may not always be located and such creation errors, especially pronoun confusion, do NOT reflect on the standard of medical care.   Florie Elsie Ruth, MD 10/18/22 [phone]

## 2022-10-26 ENCOUNTER — Other Ambulatory Visit: Payer: Self-pay | Admitting: Internal Medicine

## 2022-10-26 DIAGNOSIS — E1165 Type 2 diabetes mellitus with hyperglycemia: Secondary | ICD-10-CM

## 2022-10-27 NOTE — Telephone Encounter (Signed)
Requested medication (s) are due for refill today: na   Requested medication (s) are on the active medication list: yes   Last refill:  09/12/22 #2 ml 1 refills   Future visit scheduled: yes in 3 weeks   Notes to clinic:  medication not assigned to a protocol. Do you want to refill Rx?     Requested Prescriptions  Pending Prescriptions Disp Refills   MOUNJARO 5 MG/0.5ML Pen [Pharmacy Med Name: Mounjaro 5 MG/0.5ML Subcutaneous Solution Pen-injector] 4 mL 0    Sig: INJECT 5MG  SUBCUTANEOUSLY ONCE A WEEK     Off-Protocol Failed - 10/26/2022  4:45 PM      Failed - Medication not assigned to a protocol, review manually.      Passed - Valid encounter within last 12 months    Recent Outpatient Visits           1 month ago Type 2 diabetes mellitus with hyperglycemia, without Mand-term current use of insulin [facility])   [facility] Margarita Mail, DO   1 month ago Cough, unspecified type   [facility] Danelle Berry, PA-C   2 months ago Type 2 diabetes mellitus with hyperglycemia, without Peaden-term current use of insulin [facility])   [facility] Margarita Mail, DO   5 months ago Type 2 diabetes mellitus with hyperglycemia, without Espino-term current use of insulin [facility])   [place] [facility] Margarita Mail, DO   9 months ago Type 2 diabetes mellitus with hyperglycemia, without Uddin-term current use of insulin [facility])    [facility] Margarita Mail, DO       Future Appointments             In 3 weeks Margarita Mail, DO [facility]

## 2022-11-20 NOTE — Progress Notes (Deleted)
Established Patient Office Visit  Subjective:  Patient ID: Alvin Green, male    DOB: Sep 19, 1970  Age: 52 y.o. MRN: [id number]  CC:  No chief complaint on file.   HPI Alvin Green presents for follow up on chronic medical conditions. Discussed right knee pain at LOV, x-ray showing tricompartmental OA mostly in the medial tibiofemoral compartment with slightly progression from previous x-ray. Patient was treated with anti-inflammatories, states pain is now much improved.   Diabetes, Type 2: -Last A1c 9.0% 8/24 -Diagnosed 2019  -Medications: Metformin ER 750 mg BID, Jardiance 25 mg, Mounjaro increased to 5 mg at LOV which he has been taking and doing well with, no side effects. Last dose was last week. -Diet: trying to eat more fruit -Exercise: Not currently  -Eye exam: UTD 1/24 with [facility]  -Foot exam: UTD 1/24 -Microalbumin: UTD 8/24 -Statin: Yes -PNA vaccine: UTD -Denies symptoms of hypoglycemia, polyuria, polydipsia, numbness extremities, foot ulcers/trauma.   HLD/elevated ASCVD risk: -Medications: Crestor 10 mg  -Compliant with medication and reports no side effects -Last lipid panel: Lipid Panel     Component Value Date/Time   CHOL 154 01/24/2022 0846   TRIG 73 01/24/2022 0846   HDL 65 01/24/2022 0846   CHOLHDL 2.4 01/24/2022 0846   VLDL 18 05/30/2016 1052   LDLCALC 74 01/24/2022 0846    The 10-year ASCVD risk score (Arnett DK, et al., 2019) is: 19.3%   Values used to calculate the score:     Age: 16 years     Sex: Male     Is Non-Hispanic African American: Yes     Diabetic: Yes     Tobacco smoker: Yes     Systolic Blood Pressure: 146 mmHg     Is BP treated: No     HDL Cholesterol: 65 mg/dL     Total Cholesterol: 154 mg/dL  OSA/Obesity: -CPAP nightly, working on lifestyle change  Health Maintenance: -Blood work UTD -Colon cancer screening: Cologuard 4/23 negative    Past Medical History:  Diagnosis Date   Acquired trigger  finger 08/02/2017   Decreased libido    Heart murmur    Obesity    Sleep apnea in adult    Sprain of metacarpophalangeal joint 08/02/2017   Type 2 diabetes mellitus (HCC) 04/28/2016    Past Surgical History:  Procedure Laterality Date   CARPAL TUNNEL RELEASE     CARPAL TUNNEL RELEASE Right    approximately 10 years ago    Family History  Problem Relation Age of Onset   Hypertension Mother    Sleep disorder Father    Hypertension Brother     Social History   Socioeconomic History   Marital status: Married    Spouse name: Bronson Ing   Number of children: 2   Years of education: Not on file   Highest education level: Some college, no degree  Occupational History   Not on file  Tobacco Use   Smoking status: Every Day    Current packs/day: 0.50    Average packs/day: 0.5 packs/day for 24.0 years (12.0 ttl pk-yrs)    Types: Cigarettes   Smokeless tobacco: Never   Tobacco comments:    most of years less than half a pack per day   Vaping Use   Vaping status: Never Used  Substance and Sexual Activity   Alcohol use: Yes    Alcohol/week: 0.0 standard drinks of alcohol    Comment: occasionally   Drug use: No   Sexual activity:  Yes    Partners: Female    Birth control/protection: None  Other Topics Concern   Not on file  Social History Narrative   Works at [employer]   Social Determinants of [employer]: Low Risk  (03/04/2018)   Overall Financial Resource Strain (CARDIA)    Difficulty of Paying Living Expenses: Not hard at all  Food Insecurity: No Food Insecurity (03/04/2018)   Hunger Vital Sign    Worried About Running Out of Food in the Last Year: Never true    Ran Out of Food in the Last Year: Never true  Transportation Needs: No Transportation Needs (03/04/2018)   PRAPARE - Administrator, Civil Service (Medical): No    Lack of Transportation (Non-Medical): No  Physical Activity: Inactive (03/04/2018)   Exercise Vital Sign    Days of  Exercise per Week: 0 days    Minutes of Exercise per Session: 0 min  Stress: Stress Concern Present (03/04/2018)   [employer] of Occupational Health - Occupational Stress Questionnaire    Feeling of Stress : To some extent  Social Connections: Somewhat Isolated (03/04/2018)   Social Connection and Isolation Panel [NHANES]    Frequency of Communication with Friends and Family: More than three times a week    Frequency of Social Gatherings with Friends and Family: Never    Attends Religious Services: Never    Database administrator or Organizations: No    Attends Banker Meetings: Never    Marital Status: Married  Catering manager Violence: Not At Risk (03/04/2018)   Humiliation, Afraid, Rape, and Kick questionnaire    Fear of Current or Ex-Partner: No    Emotionally Abused: No    Physically Abused: No    Sexually Abused: No    Outpatient Medications Prior to Visit  Medication Sig Dispense Refill   aspirin EC 81 MG tablet Take 1 tablet (81 mg total) by mouth daily. 90 tablet 0   empagliflozin (JARDIANCE) 25 MG TABS tablet Take 1 tablet (25 mg total) by mouth daily before breakfast. 90 tablet 1   metFORMIN (GLUCOPHAGE XR) 750 MG 24 hr tablet Take 1 tablet (750 mg total) by mouth 2 (two) times daily with a meal. 90 tablet 1   MOUNJARO 5 MG/0.5ML Pen INJECT 5MG  SUBCUTANEOUSLY ONCE A WEEK 4 mL 0   naproxen (NAPROSYN) 500 MG tablet Take 1 tablet (500 mg total) by mouth daily as needed for moderate pain. 20 tablet 0   rosuvastatin (CRESTOR) 10 MG tablet Take 1 tablet (10 mg total) by mouth daily. 90 tablet 1   No facility-administered medications prior to visit.    No Known Allergies  ROS Review of Systems  Constitutional:  Negative for chills and fever.  Eyes:  Negative for visual disturbance.  Respiratory:  Negative for cough.   Cardiovascular:  Negative for chest pain.  Gastrointestinal:  Negative for abdominal pain, diarrhea, nausea and vomiting.  Genitourinary:   Negative for dysuria and frequency.  Musculoskeletal:  Negative for arthralgias.      Objective:    Physical Exam Constitutional:      Appearance: Normal appearance.  HENT:     Head: Normocephalic and atraumatic.  Eyes:     Conjunctiva/sclera: Conjunctivae normal.  Cardiovascular:     Rate and Rhythm: Normal rate and regular rhythm.  Pulmonary:     Effort: Pulmonary effort is normal.     Breath sounds: Normal breath sounds.  Skin:  General: Skin is warm and dry.  Neurological:     General: No focal deficit present.     Mental Status: He is alert. Mental status is at baseline.  Psychiatric:        Mood and Affect: Mood normal.        Behavior: Behavior normal.     There were no vitals taken for this visit. Wt Readings from Last 3 Encounters:  09/12/22 231 lb 8 oz (105 kg)  08/31/22 232 lb 4.8 oz (105.4 kg)  08/15/22 230 lb 4.8 oz (104.5 kg)     Health Maintenance Due  Topic Date Due   Zoster Vaccines- Shingrix (1 of 2) Never done   COVID-19 Vaccine (3 - 2023-24 season) 09/03/2022    There are no preventive care reminders to display for this patient.  Lab Results  Component Value Date   TSH 2.03 04/28/2016   Lab Results  Component Value Date   WBC 7.3 05/10/2022   HGB 13.3 05/10/2022   HCT 40.6 05/10/2022   MCV 87.9 05/10/2022   PLT 171 05/10/2022   Lab Results  Component Value Date   NA 138 05/10/2022   K 5.7 (H) 05/10/2022   CO2 25 05/10/2022   GLUCOSE 230 (H) 05/10/2022   BUN 18 05/10/2022   CREATININE 0.87 05/10/2022   BILITOT 0.5 05/10/2022   ALKPHOS 91 04/28/2016   AST 15 05/10/2022   ALT 20 05/10/2022   PROT 6.9 05/10/2022   ALBUMIN 4.2 04/28/2016   CALCIUM 9.9 05/10/2022   EGFR 104 05/10/2022   Lab Results  Component Value Date   CHOL 154 01/24/2022   Lab Results  Component Value Date   HDL 65 01/24/2022   Lab Results  Component Value Date   LDLCALC 74 01/24/2022   Lab Results  Component Value Date   TRIG 73 01/24/2022    Lab Results  Component Value Date   CHOLHDL 2.4 01/24/2022   Lab Results  Component Value Date   HGBA1C 9.0 (A) 08/15/2022      Assessment & Plan:   1. Type 2 diabetes mellitus with hyperglycemia, without Zuk-term current use of insulin [facility]): Here for recheck on Mounjaro, doing well with it so far. Will increase dose to 5 mg weekly, will return in 2 months to recheck A1c. Continue Metformin and Jardiance as well.  - tirzepatide [facility]) 5 MG/0.5ML Pen; Inject 5 mg into the skin once a week.  Dispense: 2 mL; Refill: 1  2. Osteoarthritis of right knee, unspecified osteoarthritis type: Pain resolved, can take NSAID's as needed but if this becomes more regular can do a steroid injection. Continue to monitor.   Follow-up: No follow-ups on file.    Margarita Mail, DO

## 2022-11-21 ENCOUNTER — Ambulatory Visit: Payer: BC Managed Care – PPO | Admitting: Internal Medicine

## 2022-12-11 NOTE — Progress Notes (Unsigned)
 Established Patient Office Visit  Subjective:  Patient ID: Alvin Green, male    DOB: Sep 19, 1970  Age: 52 y.o. MRN: [id number]  CC:  No chief complaint on file.   HPI Alvin Green presents for follow up on chronic medical conditions. Discussed right knee pain at LOV, x-ray showing tricompartmental OA mostly in the medial tibiofemoral compartment with slightly progression from previous x-ray. Patient was treated with anti-inflammatories, states pain is now much improved.   Diabetes, Type 2: -Last A1c 9.0% 8/24 -Diagnosed 2019  -Medications: Metformin ER 750 mg BID, Jardiance 25 mg, Mounjaro increased to 5 mg at LOV which he has been taking and doing well with, no side effects. Last dose was last week. -Diet: trying to eat more fruit -Exercise: Not currently  -Eye exam: UTD 1/24 with [facility]  -Foot exam: UTD 1/24 -Microalbumin: UTD 8/24 -Statin: Yes -PNA vaccine: UTD -Denies symptoms of hypoglycemia, polyuria, polydipsia, numbness extremities, foot ulcers/trauma.   HLD/elevated ASCVD risk: -Medications: Crestor 10 mg  -Compliant with medication and reports no side effects -Last lipid panel: Lipid Panel     Component Value Date/Time   CHOL 154 01/24/2022 0846   TRIG 73 01/24/2022 0846   HDL 65 01/24/2022 0846   CHOLHDL 2.4 01/24/2022 0846   VLDL 18 05/30/2016 1052   LDLCALC 74 01/24/2022 0846    The 10-year ASCVD risk score (Arnett DK, et al., 2019) is: 19.3%   Values used to calculate the score:     Age: 16 years     Sex: Male     Is Non-Hispanic African American: Yes     Diabetic: Yes     Tobacco smoker: Yes     Systolic Blood Pressure: 146 mmHg     Is BP treated: No     HDL Cholesterol: 65 mg/dL     Total Cholesterol: 154 mg/dL  OSA/Obesity: -CPAP nightly, working on lifestyle change  Health Maintenance: -Blood work UTD -Colon cancer screening: Cologuard 4/23 negative    Past Medical History:  Diagnosis Date   Acquired trigger  finger 08/02/2017   Decreased libido    Heart murmur    Obesity    Sleep apnea in adult    Sprain of metacarpophalangeal joint 08/02/2017   Type 2 diabetes mellitus (HCC) 04/28/2016    Past Surgical History:  Procedure Laterality Date   CARPAL TUNNEL RELEASE     CARPAL TUNNEL RELEASE Right    approximately 10 years ago    Family History  Problem Relation Age of Onset   Hypertension Mother    Sleep disorder Father    Hypertension Brother     Social History   Socioeconomic History   Marital status: Married    Spouse name: Alvin Green   Number of children: 2   Years of education: Not on file   Highest education level: Some college, no degree  Occupational History   Not on file  Tobacco Use   Smoking status: Every Day    Current packs/day: 0.50    Average packs/day: 0.5 packs/day for 24.0 years (12.0 ttl pk-yrs)    Types: Cigarettes   Smokeless tobacco: Never   Tobacco comments:    most of years less than half a pack per day   Vaping Use   Vaping status: Never Used  Substance and Sexual Activity   Alcohol use: Yes    Alcohol/week: 0.0 standard drinks of alcohol    Comment: occasionally   Drug use: No   Sexual activity:  Yes    Partners: Female    Birth control/protection: None  Other Topics Concern   Not on file  Social History Narrative   Works at [employer]   Social Determinants of [employer]: Low Risk  (03/04/2018)   Overall Financial Resource Strain (CARDIA)    Difficulty of Paying Living Expenses: Not hard at all  Food Insecurity: No Food Insecurity (03/04/2018)   Hunger Vital Sign    Worried About Running Out of Food in the Last Year: Never true    Ran Out of Food in the Last Year: Never true  Transportation Needs: No Transportation Needs (03/04/2018)   PRAPARE - Administrator, Civil Service (Medical): No    Lack of Transportation (Non-Medical): No  Physical Activity: Inactive (03/04/2018)   Exercise Vital Sign    Days of  Exercise per Week: 0 days    Minutes of Exercise per Session: 0 min  Stress: Stress Concern Present (03/04/2018)   [employer] of Occupational Health - Occupational Stress Questionnaire    Feeling of Stress : To some extent  Social Connections: Somewhat Isolated (03/04/2018)   Social Connection and Isolation Panel [NHANES]    Frequency of Communication with Friends and Family: More than three times a week    Frequency of Social Gatherings with Friends and Family: Never    Attends Religious Services: Never    Database administrator or Organizations: No    Attends Banker Meetings: Never    Marital Status: Married  Catering manager Violence: Not At Risk (03/04/2018)   Humiliation, Afraid, Rape, and Kick questionnaire    Fear of Current or Ex-Partner: No    Emotionally Abused: No    Physically Abused: No    Sexually Abused: No    Outpatient Medications Prior to Visit  Medication Sig Dispense Refill   aspirin EC 81 MG tablet Take 1 tablet (81 mg total) by mouth daily. 90 tablet 0   empagliflozin (JARDIANCE) 25 MG TABS tablet Take 1 tablet (25 mg total) by mouth daily before breakfast. 90 tablet 1   metFORMIN (GLUCOPHAGE XR) 750 MG 24 hr tablet Take 1 tablet (750 mg total) by mouth 2 (two) times daily with a meal. 90 tablet 1   MOUNJARO 5 MG/0.5ML Pen INJECT 5MG  SUBCUTANEOUSLY ONCE A WEEK 4 mL 0   naproxen (NAPROSYN) 500 MG tablet Take 1 tablet (500 mg total) by mouth daily as needed for moderate pain. 20 tablet 0   rosuvastatin (CRESTOR) 10 MG tablet Take 1 tablet (10 mg total) by mouth daily. 90 tablet 1   No facility-administered medications prior to visit.    No Known Allergies  ROS Review of Systems  Constitutional:  Negative for chills and fever.  Eyes:  Negative for visual disturbance.  Respiratory:  Negative for cough.   Cardiovascular:  Negative for chest pain.  Gastrointestinal:  Negative for abdominal pain, diarrhea, nausea and vomiting.  Genitourinary:   Negative for dysuria and frequency.  Musculoskeletal:  Negative for arthralgias.      Objective:    Physical Exam Constitutional:      Appearance: Normal appearance.  HENT:     Head: Normocephalic and atraumatic.  Eyes:     Conjunctiva/sclera: Conjunctivae normal.  Cardiovascular:     Rate and Rhythm: Normal rate and regular rhythm.  Pulmonary:     Effort: Pulmonary effort is normal.     Breath sounds: Normal breath sounds.  Skin:  General: Skin is warm and dry.  Neurological:     General: No focal deficit present.     Mental Status: He is alert. Mental status is at baseline.  Psychiatric:        Mood and Affect: Mood normal.        Behavior: Behavior normal.     There were no vitals taken for this visit. Wt Readings from Last 3 Encounters:  09/12/22 231 lb 8 oz (105 kg)  08/31/22 232 lb 4.8 oz (105.4 kg)  08/15/22 230 lb 4.8 oz (104.5 kg)     Health Maintenance Due  Topic Date Due   Zoster Vaccines- Shingrix (1 of 2) Never done   COVID-19 Vaccine (3 - 2023-24 season) 09/03/2022    There are no preventive care reminders to display for this patient.  Lab Results  Component Value Date   TSH 2.03 04/28/2016   Lab Results  Component Value Date   WBC 7.3 05/10/2022   HGB 13.3 05/10/2022   HCT 40.6 05/10/2022   MCV 87.9 05/10/2022   PLT 171 05/10/2022   Lab Results  Component Value Date   NA 138 05/10/2022   K 5.7 (H) 05/10/2022   CO2 25 05/10/2022   GLUCOSE 230 (H) 05/10/2022   BUN 18 05/10/2022   CREATININE 0.87 05/10/2022   BILITOT 0.5 05/10/2022   ALKPHOS 91 04/28/2016   AST 15 05/10/2022   ALT 20 05/10/2022   PROT 6.9 05/10/2022   ALBUMIN 4.2 04/28/2016   CALCIUM 9.9 05/10/2022   EGFR 104 05/10/2022   Lab Results  Component Value Date   CHOL 154 01/24/2022   Lab Results  Component Value Date   HDL 65 01/24/2022   Lab Results  Component Value Date   LDLCALC 74 01/24/2022   Lab Results  Component Value Date   TRIG 73 01/24/2022    Lab Results  Component Value Date   CHOLHDL 2.4 01/24/2022   Lab Results  Component Value Date   HGBA1C 9.0 (A) 08/15/2022      Assessment & Plan:   1. Type 2 diabetes mellitus with hyperglycemia, without Zuk-term current use of insulin [facility]): Here for recheck on Mounjaro, doing well with it so far. Will increase dose to 5 mg weekly, will return in 2 months to recheck A1c. Continue Metformin and Jardiance as well.  - tirzepatide [facility]) 5 MG/0.5ML Pen; Inject 5 mg into the skin once a week.  Dispense: 2 mL; Refill: 1  2. Osteoarthritis of right knee, unspecified osteoarthritis type: Pain resolved, can take NSAID's as needed but if this becomes more regular can do a steroid injection. Continue to monitor.   Follow-up: No follow-ups on file.    Margarita Mail, DO

## 2022-12-12 ENCOUNTER — Ambulatory Visit: Payer: BC Managed Care – PPO | Admitting: Internal Medicine

## 2022-12-12 ENCOUNTER — Encounter: Payer: Self-pay | Admitting: Internal Medicine

## 2022-12-12 VITALS — BP 128/78 | HR 96 | Temp 98.5°F | Resp 18 | Ht 66.0 in | Wt 225.0 lb

## 2022-12-12 DIAGNOSIS — Z7984 Long term (current) use of oral hypoglycemic drugs: Secondary | ICD-10-CM | POA: Diagnosis not present

## 2022-12-12 DIAGNOSIS — Z23 Encounter for immunization: Secondary | ICD-10-CM | POA: Diagnosis not present

## 2022-12-12 DIAGNOSIS — E1165 Type 2 diabetes mellitus with hyperglycemia: Secondary | ICD-10-CM | POA: Diagnosis not present

## 2022-12-12 DIAGNOSIS — S61259A Open bite of unspecified finger without damage to nail, initial encounter: Secondary | ICD-10-CM

## 2022-12-12 DIAGNOSIS — E782 Mixed hyperlipidemia: Secondary | ICD-10-CM | POA: Diagnosis not present

## 2022-12-12 DIAGNOSIS — W540XXA Bitten by dog, initial encounter: Secondary | ICD-10-CM

## 2022-12-12 LAB — POCT GLYCOSYLATED HEMOGLOBIN (HGB A1C): Hemoglobin A1C: 6.3 % — AB (ref 4.0–5.6)

## 2022-12-12 MED ORDER — EMPAGLIFLOZIN 10 MG PO TABS: 10.0000 mg | ORAL_TABLET | Freq: Every day | ORAL | 1 refills | Status: DC

## 2022-12-12 MED ORDER — MOUNJARO 5 MG/0.5ML ~~LOC~~ SOAJ
5.0000 mg | SUBCUTANEOUS | 1 refills | Status: DC
Start: 2022-12-12 — End: 2023-01-22

## 2022-12-12 MED ORDER — ROSUVASTATIN CALCIUM 10 MG PO TABS
10.0000 mg | ORAL_TABLET | Freq: Every day | ORAL | 1 refills | Status: DC
Start: 2022-12-12 — End: 2023-01-22

## 2023-01-10 ENCOUNTER — Ambulatory Visit: Payer: Self-pay

## 2023-01-10 NOTE — Telephone Encounter (Signed)
 Patient called, left VM to return the call to the office to speak to the NT.   Summary: Cough, chills, diarrhea, vomiting   The mother of the patient called in stating the patient woke up with chills, cough, congestion, vomiting and diarrhea. She states she has had a head cold herself. Please assist patient further

## 2023-01-10 NOTE — Telephone Encounter (Signed)
 Reason for Disposition  MILD vomiting with diarrhea  Answer Assessment - Initial Assessment Questions 1. VOMITING SEVERITY: How many times have you vomited in the past 24 hours?     - MILD:  1 - 2 times/day    - MODERATE: 3 - 5 times/day, decreased oral intake without significant weight loss or symptoms of dehydration    - SEVERE: 6 or more times/day, vomits everything or nearly everything, with significant weight loss, symptoms of dehydration      Vomiting and diarrhea Chills too.    2. ONSET: When did the vomiting begin?      This morning.    I've not taken any thing for the diarrhea or vomiting. 3. FLUIDS: What fluids or food have you vomited up today? Have you been able to keep any fluids down?     I'm drinking Gator Aid.     4. ABDOMEN PAIN: Are your having any abdomen pain? If Yes : How bad is it and what does it feel like? (e.g., crampy, dull, intermittent, constant)      I'm having abd pain.   It's in the middle of my stomach. 5. DIARRHEA: Is there any diarrhea? If Yes, ask: How many times today?      I'm having diarrhea and cramping. 6. CONTACTS: Is there anyone else in the family with the same symptoms?      Not asked 7. CAUSE: What do you think is causing your vomiting?     I don't think I'm having fever.    I don't feel like it. 8. HYDRATION STATUS: Any signs of dehydration? (e.g., dry mouth [not only dry lips], too weak to stand) When did you last urinate?     I'm drinking Ginger Ale.    I'm eating crackers 9. OTHER SYMPTOMS: Do you have any other symptoms? (e.g., fever, headache, vertigo, vomiting blood or coffee grounds, recent head injury)     Just chills. 10. PREGNANCY: Is there any chance you are pregnant? When was your last menstrual period?       N/A  Protocols used: Vomiting-A-AH  Chief Complaint: vomiting and diarrhea that started this morning. Symptoms: above with chills Frequency: Since this morning Pertinent Negatives: Patient  denies any other symptoms Disposition: [] ED /[] Urgent Care (no appt availability in office) / [] Appointment(In office/virtual)/ []  [facility]/ Home Care/ [] Refused Recommended Disposition /[] [facility]/ []  Follow-up with PCP Additional Notes: I went over the Home Care Advice with him.   I instructed him to call us  back if he is not getting better in 2-3 days or the Pepto Bismol doesn't help.  Also if he develops fever over 3 days.    He was agreeable to this plan.    He works at [employer] so I advised him he should not be working around food while having diarrhea and vomiting and to check with his employer regarding their sick policy.

## 2023-01-22 ENCOUNTER — Other Ambulatory Visit: Payer: Self-pay | Admitting: Internal Medicine

## 2023-01-22 DIAGNOSIS — E1165 Type 2 diabetes mellitus with hyperglycemia: Secondary | ICD-10-CM

## 2023-01-22 DIAGNOSIS — E782 Mixed hyperlipidemia: Secondary | ICD-10-CM

## 2023-01-22 MED ORDER — ROSUVASTATIN CALCIUM 10 MG PO TABS: 10.0000 mg | ORAL_TABLET | Freq: Every day | ORAL | 1 refills | Status: DC

## 2023-01-22 MED ORDER — EMPAGLIFLOZIN 10 MG PO TABS: 10.0000 mg | ORAL_TABLET | Freq: Every day | ORAL | 1 refills | Status: DC

## 2023-01-22 NOTE — Telephone Encounter (Signed)
Requested medication (s) are due for refill today: Yes  Requested medication (s) are on the active medication list: Yes  Last refill:  12/12/22  Future visit scheduled:   Notes to clinic:  Manual review.    Requested Prescriptions  Pending Prescriptions Disp Refills   tirzepatide (MOUNJARO) 5 MG/0.5ML Pen 6 mL 1    Sig: Inject 5 mg into the skin once a week.     Off-Protocol Failed - 01/22/2023  3:45 PM      Failed - Medication not assigned to a protocol, review manually.      Passed - Valid encounter within last 12 months    Recent Outpatient Visits           1 month ago Type 2 diabetes mellitus with hyperglycemia, without Manfredi-term current use of insulin [facility])   [place] [facility] Margarita Mail, DO   4 months ago Type 2 diabetes mellitus with hyperglycemia, without Ruggieri-term current use of insulin [facility])   [facility] Margarita Mail, DO   4 months ago Cough, unspecified type   [facility] Danelle Berry, PA-C   5 months ago Type 2 diabetes mellitus with hyperglycemia, without Shanks-term current use of insulin [facility])   [facility] Margarita Mail, DO   8 months ago Type 2 diabetes mellitus with hyperglycemia, without Depaoli-term current use of insulin [facility])   [place] [facility] Margarita Mail, DO       Future Appointments             In 1 month Margarita Mail, DO [facility], [facility]            Signed Prescriptions Disp Refills   rosuvastatin (CRESTOR) 10 MG tablet 90 tablet 1    Sig: Take 1 tablet (10 mg total) by mouth daily.     Cardiovascular:  Antilipid - Statins 2 Failed - 01/22/2023  3:45 PM      Failed - Lipid Panel in normal range within the last 12 months    Cholesterol  Date Value Ref Range Status  01/24/2022 154 <200 mg/dL Final   LDL Cholesterol (Calc)  Date Value Ref Range Status   01/24/2022 74 mg/dL (calc) Final    Comment:    Reference range: <100 . Desirable range <100 mg/dL for primary prevention;   <70 mg/dL for patients with CHD or diabetic patients  with > or = 2 CHD risk factors. Marland Kitchen LDL-C is now calculated using the Martin-Hopkins  calculation, which is a validated novel method providing  better accuracy than the Friedewald equation in the  estimation of LDL-C.  Horald Pollen et al. Lenox Ahr. 4098;119(14): 2061-2068  (http://education.[facility].com/faq/FAQ164)    HDL  Date Value Ref Range Status  01/24/2022 65 > OR = 40 mg/dL Final   Triglycerides  Date Value Ref Range Status  01/24/2022 73 <150 mg/dL Final         Passed - Cr in normal range and within 360 days    Creat  Date Value Ref Range Status  05/10/2022 0.87 0.70 - 1.30 mg/dL Final   Creatinine, Urine  Date Value Ref Range Status  08/15/2022 55 20 - 320 mg/dL Final         Passed - Patient is not pregnant      Passed - Valid encounter within last 12 months    Recent Outpatient Visits           1  month ago Type 2 diabetes mellitus with hyperglycemia, without Fedora-term current use of insulin [facility])   [place] [facility] Margarita Mail, DO   4 months ago Type 2 diabetes mellitus with hyperglycemia, without Imm-term current use of insulin [facility])   [place] [facility] Margarita Mail, DO   4 months ago Cough, unspecified type   [facility] Dan[facility] Berry, PA-C   5 months ago Type 2 diabetes mellitus with hyperglycemia, without Wilbourn-term current use of insulin [facility])   [place] [facility] Margarita Mail, DO   8 months ago Type 2 diabetes mellitus with hyperglycemia, without Harbour-term current use of insulin [facility])   [place] [facility] Margarita Mail, DO       Future Appointments             In 1 month Margarita Mail, DO [place] [facility],  [facility]             empagliflozin (JARDIANCE) 10 MG TABS tablet 90 tablet 1    Sig: Take 1 tablet (10 mg total) by mouth daily before breakfast.     Endocrinology:  Diabetes - SGLT2 Inhibitors Passed - 01/22/2023  3:45 PM      Passed - Cr in normal range and within 360 days    Creat  Date Value Ref Range Status  05/10/2022 0.87 0.70 - 1.30 mg/dL Final   Creatinine, Urine  Date Value Ref Range Status  08/15/2022 55 20 - 320 mg/dL Final         Passed - HBA1C is between 0 and 7.9 and within 180 days    Hemoglobin A1C  Date Value Ref Range Status  12/12/2022 6.3 (A) 4.0 - 5.6 % Final   Hgb A1c MFr Bld  Date Value Ref Range Status  05/10/2022 9.6 (H) <5.7 % of total Hgb Final    Comment:    For someone without known diabetes, a hemoglobin A1c value of 6.5% or greater indicates that they may have  diabetes and this should be confirmed with a follow-up  test. . For someone with known diabetes, a value <7% indicates  that their diabetes is well controlled and a value  greater than or equal to 7% indicates suboptimal  control. A1c targets should be individualized based on  duration of diabetes, age, comorbid conditions, and  other considerations. . Currently, no consensus exists regarding use of hemoglobin A1c for diagnosis of diabetes for children. .          Passed - eGFR in normal range and within 360 days    GFR, Est African American  Date Value Ref Range Status  02/24/2020 126 > OR = 60 mL/min/1.70m2 Final   GFR, Est Non African American  Date Value Ref Range Status  02/24/2020 108 > OR = 60 mL/min/1.7m2 Final   eGFR  Date Value Ref Range Status  05/10/2022 104 > OR = 60 mL/min/1.81m2 Final         Passed - Valid encounter within last 6 months    Recent Outpatient Visits           1 month ago Type 2 diabetes mellitus with hyperglycemia, without Szydlowski-term current use of insulin [facility])    [facility] Margarita Mail, DO   4  months ago Type 2 diabetes mellitus with hyperglycemia, without Calix-term current use of insulin [facility])   [facility] Margarita Mail, DO   4 months  ago Cough, unspecified type   [facility] Danelle Berry, PA-C   5 months ago Type 2 diabetes mellitus with hyperglycemia, without Kellison-term current use of insulin [facility])   [facility] Margarita Mail, DO   8 months ago Type 2 diabetes mellitus with hyperglycemia, without Frosch-term current use of insulin [facility])   [facility] Margarita Mail, DO       Future Appointments             In 1 month Margarita Mail, DO [facility], [facility]

## 2023-01-22 NOTE — Telephone Encounter (Signed)
Medication Refill -  Most Recent Primary Care Visit:  Provider: Margarita Mail  Department: [facility]  Visit Type: OFFICE VISIT  Date: 12/12/2022  Medication:empagliflozin (JARDIANCE) 10 MG TABS tablet rosuvastatin (CRESTOR) 10 MG tablet tirzepatide Greggory Keen) 5 MG/0.5ML Pen  Has the patient contacted their pharmacy? No Pt has changed insurance company and now needs to change hs pharmacy.  On 12/12/2022 pt only picked up a 30 day of each of his meds.  Please transfer anything he did not pick up.  Pt is confused on how to do this. Is this the correct pharmacy for this prescription? Yes If no, delete pharmacy and type the correct one.   This is the patient's preferred pharmacy:  [facility] [phone] Dan Humphreys, [place] - [address] [place] [address] Phone: [phone] Fax: [phone]   Has the prescription been filled recently? Yes  Is the patient out of the medication? Yes  Has the patient been seen for an appointment in the last year OR does the patient have an upcoming appointment? Yes  Can we respond through MyChart? No  Agent: Please be advised that Rx refills may take up to 3 business days. We ask that you follow-up with your pharmacy.

## 2023-01-22 NOTE — Telephone Encounter (Signed)
Requested Prescriptions  Pending Prescriptions Disp Refills   rosuvastatin (CRESTOR) 10 MG tablet 90 tablet 1    Sig: Take 1 tablet (10 mg total) by mouth daily.     Cardiovascular:  Antilipid - Statins 2 Failed - 01/22/2023  3:44 PM      Failed - Lipid Panel in normal range within the last 12 months    Cholesterol  Date Value Ref Range Status  01/24/2022 154 <200 mg/dL Final   LDL Cholesterol (Calc)  Date Value Ref Range Status  01/24/2022 74 mg/dL (calc) Final    Comment:    Reference range: <100 . Desirable range <100 mg/dL for primary prevention;   <70 mg/dL for patients with CHD or diabetic patients  with > or = 2 CHD risk factors. Marland Kitchen LDL-C is now calculated using the Martin-Hopkins  calculation, which is a validated novel method providing  better accuracy than the Friedewald equation in the  estimation of LDL-C.  Horald Pollen et al. Lenox Ahr. 2956;213(08): 2061-2068  (http://education.[facility].com/faq/FAQ164)    HDL  Date Value Ref Range Status  01/24/2022 65 > OR = 40 mg/dL Final   Triglycerides  Date Value Ref Range Status  01/24/2022 73 <150 mg/dL Final         Passed - Cr in normal range and within 360 days    Creat  Date Value Ref Range Status  05/10/2022 0.87 0.70 - 1.30 mg/dL Final   Creatinine, Urine  Date Value Ref Range Status  08/15/2022 55 20 - 320 mg/dL Final         Passed - Patient is not pregnant      Passed - Valid encounter within last 12 months    Recent Outpatient Visits           1 month ago Type 2 diabetes mellitus with hyperglycemia, without Emme-term current use of insulin (HCC)   [facility] Margarita Mail, DO   4 months ago Type 2 diabetes mellitus with hyperglycemia, without Coddington-term current use of insulin [facility])   [place] [facility] Margarita Mail, DO   4 months ago Cough, unspecified type   [facility] Danelle Berry, PA-C   5 months ago Type  2 diabetes mellitus with hyperglycemia, without Lothrop-term current use of insulin [facility])   [place] [facility] Margarita Mail, DO   8 months ago Type 2 diabetes mellitus with hyperglycemia, without Gellis-term current use of insulin [facility])   [place] [facility] Margarita Mail, DO       Future Appointments             In 1 month Margarita Mail, DO [place] [facility], [facility]             empagliflozin (JARDIANCE) 10 MG TABS tablet 90 tablet 1    Sig: Take 1 tablet (10 mg total) by mouth daily before breakfast.     Endocrinology:  Diabetes - SGLT2 Inhibitors Passed - 01/22/2023  3:44 PM      Passed - Cr in normal range and within 360 days    Creat  Date Value Ref Range Status  05/10/2022 0.87 0.70 - 1.30 mg/dL Final   Creatinine, Urine  Date Value Ref Range Status  08/15/2022 55 20 - 320 mg/dL Final         Passed - HBA1C is between 0 and 7.9 and within 180 days    Hemoglobin A1C  Date Value Ref Range Status  12/12/2022 6.3 (A) 4.0 - 5.6 % Final   Hgb A1c MFr Bld  Date Value Ref Range Status  05/10/2022 9.6 (H) <5.7 % of total Hgb Final    Comment:    For someone without known diabetes, a hemoglobin A1c value of 6.5% or greater indicates that they may have  diabetes and this should be confirmed with a follow-up  test. . For someone with known diabetes, a value <7% indicates  that their diabetes is well controlled and a value  greater than or equal to 7% indicates suboptimal  control. A1c targets should be individualized based on  duration of diabetes, age, comorbid conditions, and  other considerations. . Currently, no consensus exists regarding use of hemoglobin A1c for diagnosis of diabetes for children. .          Passed - eGFR in normal range and within 360 days    GFR, Est African American  Date Value Ref Range Status  02/24/2020 126 > OR = 60 mL/min/1.76m2 Final   GFR, Est Non African  American  Date Value Ref Range Status  02/24/2020 108 > OR = 60 mL/min/1.25m2 Final   eGFR  Date Value Ref Range Status  05/10/2022 104 > OR = 60 mL/min/1.51m2 Final         Passed - Valid encounter within last 6 months    Recent Outpatient Visits           1 month ago Type 2 diabetes mellitus with hyperglycemia, without Anna-term current use of insulin [facility])   [place] [facility] Margarita Mail, DO   4 months ago Type 2 diabetes mellitus with hyperglycemia, without Cueva-term current use of insulin [facility])   [place] [facility] Margarita Mail, DO   4 months ago Cough, unspecified type   [facility] Danelle Berry, PA-C   5 months ago Type 2 diabetes mellitus with hyperglycemia, without Stogner-term current use of insulin [facility])   [facility] Margarita Mail, DO   8 months ago Type 2 diabetes mellitus with hyperglycemia, without Ozier-term current use of insulin [facility])   [place] Margarita Mail, DO       Future Appointments             In 1 month Margarita Mail, DO [place] [facility], [facility]             tirzepatide [facility]) 5 MG/0.5ML Pen 6 mL 1    Sig: Inject 5 mg into the skin once a week.     Off-Protocol Failed - 01/22/2023  3:44 PM      Failed - Medication not assigned to a protocol, review manually.      Passed - Valid encounter within last 12 months    Recent Outpatient Visits           1 month ago Type 2 diabetes mellitus with hyperglycemia, without Kinslow-term current use of insulin [facility])   [place] [facility] Margarita Mail, DO   4 months ago Type 2 diabetes mellitus with hyperglycemia, without Vanauken-term current use of insulin [facility])   [place] [facility] Margarita Mail, DO   4 months ago Cough, unspecified type   [facility] Dan[facility] Berry,  PA-C   5 months ago Type 2 diabetes mellitus with hyperglycemia, without Pola-term current use of insulin [facility])   [facility] Margarita Mail, DO   8 months  ago Type 2 diabetes mellitus with hyperglycemia, without Awan-term current use of insulin [facility])   [place] [facility] Margarita Mail, DO       Future Appointments             In 1 month Margarita Mail, DO [facility]

## 2023-01-24 MED ORDER — MOUNJARO 5 MG/0.5ML ~~LOC~~ SOAJ
5.0000 mg | SUBCUTANEOUS | 1 refills | Status: DC
Start: 2023-01-24 — End: 2023-02-02

## 2023-01-25 ENCOUNTER — Other Ambulatory Visit: Payer: Self-pay | Admitting: Internal Medicine

## 2023-01-25 DIAGNOSIS — E1165 Type 2 diabetes mellitus with hyperglycemia: Secondary | ICD-10-CM

## 2023-01-25 NOTE — Telephone Encounter (Signed)
Pharmacy comment: Alternative Requested:PRIOR AUTH REQUIRED

## 2023-02-08 NOTE — Telephone Encounter (Signed)
 Copied from CRM [id number]. Topic: General - Inquiry >> Feb 08, 2023  1:40 PM Teressa P wrote: Reason for CRM: pt wants Dr. Bud Care nurse to call him back regarding the Mounjaro  rx.  [phone]

## 2023-02-09 ENCOUNTER — Telehealth: Payer: Self-pay | Admitting: Internal Medicine

## 2023-02-09 NOTE — Telephone Encounter (Signed)
 Called patient left vm we do not have new ins card.  This is what cover my meds said:  There was an error with your request Patient Inactive

## 2023-02-09 NOTE — Telephone Encounter (Signed)
 Patient states he has been waiting a month for a PA for tirzepatide  (MOUNJARO ) 5 MG/0.5ML Pen. Caller states he called his insurance and states medication will be covered awaiting to hear back from PCP. Caller would like a follow up call as soon as possible.

## 2023-02-09 NOTE — Telephone Encounter (Signed)
 FYI

## 2023-02-13 NOTE — Telephone Encounter (Signed)
Pt calling in to inform office that he has uploaded insurance (bcbs) card via [employer].

## 2023-03-09 ENCOUNTER — Ambulatory Visit: Payer: Self-pay | Admitting: Internal Medicine

## 2023-03-09 ENCOUNTER — Encounter: Payer: Self-pay | Admitting: Internal Medicine

## 2023-03-09 ENCOUNTER — Other Ambulatory Visit: Payer: Self-pay

## 2023-03-09 VITALS — BP 138/82 | HR 84 | Temp 98.3°F | Resp 16 | Ht 66.0 in | Wt 222.5 lb

## 2023-03-09 DIAGNOSIS — E66813 Obesity, class 3: Secondary | ICD-10-CM

## 2023-03-09 DIAGNOSIS — E782 Mixed hyperlipidemia: Secondary | ICD-10-CM

## 2023-03-09 DIAGNOSIS — Z6841 Body Mass Index (BMI) 40.0 and over, adult: Secondary | ICD-10-CM

## 2023-03-09 DIAGNOSIS — E1165 Type 2 diabetes mellitus with hyperglycemia: Secondary | ICD-10-CM

## 2023-03-09 LAB — POCT GLYCOSYLATED HEMOGLOBIN (HGB A1C): Hemoglobin A1C: 6.5 % — AB (ref 4.0–5.6)

## 2023-03-09 MED ORDER — TIRZEPATIDE 2.5 MG/0.5ML ~~LOC~~ SOAJ
2.5000 mg | SUBCUTANEOUS | 0 refills | Status: DC
Start: 2023-03-09 — End: 2023-05-11

## 2023-03-09 NOTE — Assessment & Plan Note (Signed)
 Controlled, A1c 6.5% today.  Patient is no longer on metformin and he is on Jardiance 10 mg daily.  Unfortunately we were unable to get the [facility] at the end of last year we will reorder the 2.5 mg weekly. Foot exam today, patient requesting podiatry referral due to fungal nail disease, will place referral. Follow up in 3 months.

## 2023-03-09 NOTE — Assessment & Plan Note (Signed)
 Working on [employer], restart GLP-1.

## 2023-03-09 NOTE — Progress Notes (Signed)
 Established Patient Office Visit  Subjective:  Patient ID: Alvin Green, male    DOB: [birth year]  Age: 53 y.o. MRN: [id number]  CC:  Chief Complaint  Patient presents with   Medical Management of Chronic Issues    3 month follow up    HPI Alvin Green presents for follow up on chronic medical conditions.   Diabetes, Type 2: -Last A1c 9.0% 8/24 -Diagnosed 2019  -Medications: Jardiance 10 mg, Mounjaro increased to 5 mg at LOV but patient's insurance would not cover but he now has a different insurance starting in the new year -Had been on Metformin ER 750 mg BID but discontinued at LOV due to controlled A1c -Diet: trying to eat more fruit -Exercise: Not currently  -Eye exam: UTD 1/25 with [facility]  -Foot exam: Due -Microalbumin: UTD 8/24 -Statin: Yes -PNA vaccine: UTD -Denies symptoms of hypoglycemia, polyuria, polydipsia, numbness extremities, foot ulcers/trauma.   HLD/elevated ASCVD risk: -Medications: Crestor 10 mg  -Compliant with medication and reports no side effects -Last lipid panel: Lipid Panel     Component Value Date/Time   CHOL 154 01/24/2022 0846   TRIG 73 01/24/2022 0846   HDL 65 01/24/2022 0846   CHOLHDL 2.4 01/24/2022 0846   VLDL 18 05/30/2016 1052   LDLCALC 74 01/24/2022 0846    The 10-year ASCVD risk score (Arnett DK, et al., 2019) is: 17.6%   Values used to calculate the score:     Age: 48 years     Sex: Male     Is Non-Hispanic African American: Yes     Diabetic: Yes     Tobacco smoker: Yes     Systolic Blood Pressure: 138 mmHg     Is BP treated: No     HDL Cholesterol: 65 mg/dL     Total Cholesterol: 154 mg/dL  OSA/Obesity: -CPAP nightly, working on lifestyle change  Health Maintenance: -Blood work UTD -Colon cancer screening: Cologuard 4/23 negative  -Tdap due - got bit by a dog earlier today on the left middle finger, mild bleeding but no inflammation or drainage   Past Medical History:  Diagnosis Date    Acquired trigger finger 08/02/2017   Decreased libido    Heart murmur    Obesity    Sleep apnea in adult    Sprain of metacarpophalangeal joint 08/02/2017   Type 2 diabetes mellitus (HCC) 04/28/2016    Past Surgical History:  Procedure Laterality Date   CARPAL TUNNEL RELEASE     CARPAL TUNNEL RELEASE Right    approximately 10 years ago    Family History  Problem Relation Age of Onset   Hypertension Mother    Sleep disorder Father    Hypertension Brother     Social History   Socioeconomic History   Marital status: Married    Spouse name: Bronson Ing   Number of children: 2   Years of education: Not on file   Highest education level: Some college, no degree  Occupational History   Not on file  Tobacco Use   Smoking status: Every Day    Current packs/day: 0.50    Average packs/day: 0.5 packs/day for 24.0 years (12.0 ttl pk-yrs)    Types: Cigarettes   Smokeless tobacco: Never   Tobacco comments:    most of years less than half a pack per day   Vaping Use   Vaping status: Never Used  Substance and Sexual Activity   Alcohol use: Yes    Alcohol/week: 0.0 standard drinks  of alcohol    Comment: occasionally   Drug use: No   Sexual activity: Yes    Partners: Female    Birth control/protection: None  Other Topics Concern   Not on file  Social History Narrative   Works at [employer]   Social Drivers of [employer] Drug Stores: Low Risk  (03/04/2018)   Overall Financial Resource Strain (CARDIA)    Difficulty of Paying Living Expenses: Not hard at all  Food Insecurity: No Food Insecurity (03/04/2018)   Hunger Vital Sign    Worried About Running Out of Food in the Last Year: Never true    Ran Out of Food in the Last Year: Never true  Transportation Needs: No Transportation Needs (03/04/2018)   PRAPARE - Administrator, Civil Service (Medical): No    Lack of Transportation (Non-Medical): No  Physical Activity: Inactive (03/04/2018)   Exercise Vital Sign     Days of Exercise per Week: 0 days    Minutes of Exercise per Session: 0 min  Stress: Stress Concern Present (03/04/2018)   [employer] of Occupational Health - Occupational Stress Questionnaire    Feeling of Stress : To some extent  Social Connections: Somewhat Isolated (03/04/2018)   Social Connection and Isolation Panel [NHANES]    Frequency of Communication with Friends and Family: More than three times a week    Frequency of Social Gatherings with Friends and Family: Never    Attends Religious Services: Never    Database administrator or Organizations: No    Attends Banker Meetings: Never    Marital Status: Married  Catering manager Violence: Not At Risk (03/04/2018)   Humiliation, Afraid, Rape, and Kick questionnaire    Fear of Current or Ex-Partner: No    Emotionally Abused: No    Physically Abused: No    Sexually Abused: No    Outpatient Medications Prior to Visit  Medication Sig Dispense Refill   aspirin EC 81 MG tablet Take 1 tablet (81 mg total) by mouth daily. 90 tablet 0   empagliflozin (JARDIANCE) 10 MG TABS tablet Take 1 tablet (10 mg total) by mouth daily before breakfast. 90 tablet 1   rosuvastatin (CRESTOR) 10 MG tablet Take 1 tablet (10 mg total) by mouth daily. 90 tablet 1   tirzepatide (MOUNJARO) 5 MG/0.5ML Pen INJECT 5 MG SUBCUTANEOUSLY WEEKLY (Patient not taking: Reported on 03/09/2023) 6 mL 1   No facility-administered medications prior to visit.    No Known Allergies  ROS Review of Systems  All other systems reviewed and are negative.     Objective:    Physical Exam Constitutional:      Appearance: Normal appearance.  HENT:     Head: Normocephalic and atraumatic.  Eyes:     Conjunctiva/sclera: Conjunctivae normal.  Cardiovascular:     Rate and Rhythm: Normal rate and regular rhythm.     Pulses:          Dorsalis pedis pulses are 2+ on the right side and 2+ on the left side.  Pulmonary:     Effort: Pulmonary effort is normal.      Breath sounds: Normal breath sounds.  Musculoskeletal:     Right foot: Normal range of motion. No deformity, bunion, Charcot foot, foot drop or prominent metatarsal heads.     Left foot: Normal range of motion. No deformity, bunion, Charcot foot, foot drop or prominent metatarsal heads.  Feet:     Right foot:  Protective Sensation: 6 sites tested.  6 sites sensed.     Skin integrity: Dry skin present.     Toenail Condition: Fungal disease present.    Left foot:     Protective Sensation: 6 sites tested.  6 sites sensed.     Skin integrity: Dry skin present.     Toenail Condition: Fungal disease present. Skin:    General: Skin is warm and dry.  Neurological:     General: No focal deficit present.     Mental Status: He is alert. Mental status is at baseline.  Psychiatric:        Mood and Affect: Mood normal.        Behavior: Behavior normal.     BP 138/82 (Cuff Size: Large)   Pulse 84   Temp 98.3 F (36.8 C) (Oral)   Resp 16   Ht 5\' 6"  (1.676 m)   Wt 222 lb 8 oz (100.9 kg)   SpO2 98%   BMI 35.91 kg/m  Wt Readings from Last 3 Encounters:  03/09/23 222 lb 8 oz (100.9 kg)  12/12/22 225 lb (102.1 kg)  09/12/22 231 lb 8 oz (105 kg)     Health Maintenance Due  Topic Date Due   Zoster Vaccines- Shingrix (1 of 2) Never done   COVID-19 Vaccine (3 - Pfizer risk series) 07/16/2019   FOOT EXAM  01/25/2023   OPHTHALMOLOGY EXAM  02/17/2023    There are no preventive care reminders to display for this patient.  Lab Results  Component Value Date   TSH 2.03 04/28/2016   Lab Results  Component Value Date   WBC 7.3 05/10/2022   HGB 13.3 05/10/2022   HCT 40.6 05/10/2022   MCV 87.9 05/10/2022   PLT 171 05/10/2022   Lab Results  Component Value Date   NA 138 05/10/2022   K 5.7 (H) 05/10/2022   CO2 25 05/10/2022   GLUCOSE 230 (H) 05/10/2022   BUN 18 05/10/2022   CREATININE 0.87 05/10/2022   BILITOT 0.5 05/10/2022   ALKPHOS 91 04/28/2016   AST 15 05/10/2022   ALT 20  05/10/2022   PROT 6.9 05/10/2022   ALBUMIN 4.2 04/28/2016   CALCIUM 9.9 05/10/2022   EGFR 104 05/10/2022   Lab Results  Component Value Date   CHOL 154 01/24/2022   Lab Results  Component Value Date   HDL 65 01/24/2022   Lab Results  Component Value Date   LDLCALC 74 01/24/2022   Lab Results  Component Value Date   TRIG 73 01/24/2022   Lab Results  Component Value Date   CHOLHDL 2.4 01/24/2022   Lab Results  Component Value Date   HGBA1C 6.5 (A) 03/09/2023      Assessment & Plan:   Type 2 diabetes mellitus with hyperglycemia, without Keeble-term current use of insulin (HCC) Assessment & Plan: Controlled, A1c 6.5% today.  Patient is no longer on metformin and he is on Jardiance 10 mg daily.  Unfortunately we were unable to get the [facility] at the end of last year we will reorder the 2.5 mg weekly. Foot exam today, patient requesting podiatry referral due to fungal nail disease, will place referral. Follow up in 3 months.  Orders: -     POCT glycosylated hemoglobin (Hb A1C) -     Tirzepatide; Inject 2.5 mg into the skin once a week.  Dispense: 6 mL; Refill: 0 -     HM Diabetes Foot Exam -     Ambulatory referral to  Podiatry  Mixed hyperlipidemia Assessment & Plan: Stable, continue statin.   Class 3 severe obesity without serious comorbidity with body mass index (BMI) of 40.0 to 44.9 in adult, unspecified obesity type [facility]) Assessment & Plan: Working on healthier diet, restart GLP-1.     Follow-up: Return in about 3 months (around 06/09/2023) for after 6/7 for a1c.    Margarita Mail, DO

## 2023-03-09 NOTE — Assessment & Plan Note (Signed)
 Stable, continue statin

## 2023-03-12 ENCOUNTER — Ambulatory Visit: Payer: Self-pay | Admitting: Internal Medicine

## 2023-05-08 ENCOUNTER — Other Ambulatory Visit: Payer: Self-pay | Admitting: Internal Medicine

## 2023-05-08 DIAGNOSIS — E1165 Type 2 diabetes mellitus with hyperglycemia: Secondary | ICD-10-CM

## 2023-05-10 NOTE — Telephone Encounter (Signed)
 Requested medication (s) are due for refill today: no  Requested medication (s) are on the active medication list: yes  Last refill:  03/09/23 6 ml  Future visit scheduled: yes  Notes to clinic:  med not assigned to a protocol   Requested Prescriptions  Pending Prescriptions Disp Refills   MOUNJARO  2.5 MG/0.5ML Pen [Pharmacy Med Name: MOUNJARO  2.5 MG/0.5 ML PEN]  1    Sig: INJECT 2.5 MG SUBCUTANEOUSLY WEEKLY     Off-Protocol Failed - 05/10/2023 12:07 PM      Failed - Medication not assigned to a protocol, review manually.      Passed - Valid encounter within last 12 months    Recent Outpatient Visits           2 months ago Type 2 diabetes mellitus with hyperglycemia, without Shibata-term current use of insulin  [facility])   [facility] Rockney Cid, DO       Future Appointments             In 1 month Rockney Cid, DO [facility], [facility]

## 2023-05-11 ENCOUNTER — Telehealth: Payer: Self-pay

## 2023-05-11 ENCOUNTER — Other Ambulatory Visit: Payer: Self-pay | Admitting: Internal Medicine

## 2023-05-11 DIAGNOSIS — E1165 Type 2 diabetes mellitus with hyperglycemia: Secondary | ICD-10-CM

## 2023-05-11 MED ORDER — TIRZEPATIDE 2.5 MG/0.5ML ~~LOC~~ SOAJ
2.5000 mg | SUBCUTANEOUS | 0 refills | Status: DC
Start: 2023-05-11 — End: 2023-05-11

## 2023-05-11 MED ORDER — TIRZEPATIDE 2.5 MG/0.5ML ~~LOC~~ SOAJ
2.5000 mg | SUBCUTANEOUS | 0 refills | Status: DC
Start: 2023-05-11 — End: 2023-06-12

## 2023-05-11 NOTE — Telephone Encounter (Signed)
 Error- 3 month supply sent

## 2023-05-11 NOTE — Telephone Encounter (Signed)
 Copied from CRM [id number]. Topic: Clinical - Medication Question >> May 11, 2023  1:56 PM Yolanda T wrote: Reason for CRM: patient called stated it is better for him to get a 90 day supply of tirzepatide  (MOUNJARO ) 2.5 MG/0.5ML Pen. Pease f/u with pharmacy

## 2023-06-12 ENCOUNTER — Other Ambulatory Visit: Payer: Self-pay

## 2023-06-12 ENCOUNTER — Ambulatory Visit
Admission: RE | Admit: 2023-06-12 | Discharge: 2023-06-12 | Disposition: A | Discharge status: Home / Self Care | Attending: Internal Medicine | Admitting: Internal Medicine

## 2023-06-12 ENCOUNTER — Encounter: Payer: Self-pay | Admitting: Internal Medicine

## 2023-06-12 ENCOUNTER — Ambulatory Visit: Payer: Self-pay | Admitting: Internal Medicine

## 2023-06-12 ENCOUNTER — Ambulatory Visit: Admitting: Internal Medicine

## 2023-06-12 ENCOUNTER — Ambulatory Visit
Admission: RE | Admit: 2023-06-12 | Discharge: 2023-06-12 | Disposition: A | Discharge status: Home / Self Care | Source: Ambulatory Visit | Attending: Internal Medicine

## 2023-06-12 VITALS — BP 128/72 | HR 79 | Temp 98.6°F | Resp 16 | Ht 66.0 in | Wt 216.5 lb

## 2023-06-12 DIAGNOSIS — M79645 Pain in left finger(s): Secondary | ICD-10-CM

## 2023-06-12 DIAGNOSIS — M1812 Unilateral primary osteoarthritis of first carpometacarpal joint, left hand: Secondary | ICD-10-CM | POA: Diagnosis not present

## 2023-06-12 DIAGNOSIS — Z7984 Long term (current) use of oral hypoglycemic drugs: Secondary | ICD-10-CM | POA: Diagnosis not present

## 2023-06-12 DIAGNOSIS — S6992XA Unspecified injury of left wrist, hand and finger(s), initial encounter: Secondary | ICD-10-CM | POA: Diagnosis not present

## 2023-06-12 DIAGNOSIS — M778 Other enthesopathies, not elsewhere classified: Secondary | ICD-10-CM | POA: Diagnosis not present

## 2023-06-12 DIAGNOSIS — Z7985 Long-term (current) use of injectable non-insulin antidiabetic drugs: Secondary | ICD-10-CM

## 2023-06-12 DIAGNOSIS — E782 Mixed hyperlipidemia: Secondary | ICD-10-CM | POA: Diagnosis not present

## 2023-06-12 DIAGNOSIS — E1165 Type 2 diabetes mellitus with hyperglycemia: Secondary | ICD-10-CM

## 2023-06-12 LAB — POCT GLYCOSYLATED HEMOGLOBIN (HGB A1C): Hemoglobin A1C: 6.3 % — AB (ref 4.0–5.6)

## 2023-06-12 MED ORDER — ROSUVASTATIN CALCIUM 10 MG PO TABS
10.0000 mg | ORAL_TABLET | Freq: Every day | ORAL | 1 refills | Status: AC
Start: 2023-06-12 — End: ?

## 2023-06-12 MED ORDER — EMPAGLIFLOZIN 10 MG PO TABS
10.0000 mg | ORAL_TABLET | Freq: Every day | ORAL | 1 refills | Status: AC
Start: 2023-06-12 — End: ?

## 2023-06-12 MED ORDER — TIRZEPATIDE 2.5 MG/0.5ML ~~LOC~~ SOAJ: 2.5000 mg | SUBCUTANEOUS | 0 refills | Status: AC

## 2023-06-12 NOTE — Progress Notes (Signed)
 Established Patient Office Visit  Subjective:  Patient ID: Alvin Green, male    DOB: 15-Oct-1970  Age: 53 y.o. MRN: [id number]  CC:  Chief Complaint  Patient presents with   Medical Management of Chronic Issues    3 months recheck    HPI Alvin Green presents for follow up on chronic medical conditions.   Discussed the use of AI scribe software for clinical note transcription with the patient, who gave verbal consent to proceed.  History of Present Illness Alvin Green is a 53 year old male with type 2 diabetes who presents for routine follow-up.  His type 2 diabetes is well-controlled with an A1c of 6.3%. He is on Mounjaro  2.5 mg and Jardiance , which also provides renal protection. Significant weight loss since December has contributed to improved glycemic control.  Knee pain is improving with weight loss.  He sustained a finger injury approximately ten days ago while entering his truck. He has difficulty bending the finger fully and experiences increased pain and stiffness in the morning. He is right-handed and is minimizing use of the injured finger.   Diabetes, Type 2: -Last A1c 6.5% 3/25 -Diagnosed 2019  -Medications: Jardiance  10 mg, Mounjaro  2.5 mg weekly -Had been on Metformin  ER 750 mg BID but discontinued at LOV due to controlled A1c -Diet: trying to eat more fruit -Exercise: Not currently  -Eye exam: UTD 1/25 with [facility]  -Foot exam: UTD -Microalbumin: UTD 8/24 -Statin: Yes -PNA vaccine: UTD -Denies symptoms of hypoglycemia, polyuria, polydipsia, numbness extremities, foot ulcers/trauma.   HLD/elevated ASCVD risk: -Medications: Crestor  10 mg  -Compliant with medication and reports no side effects -Last lipid panel: Lipid Panel     Component Value Date/Time   CHOL 154 01/24/2022 0846   TRIG 73 01/24/2022 0846   HDL 65 01/24/2022 0846   CHOLHDL 2.4 01/24/2022 0846   VLDL 18 05/30/2016 1052   LDLCALC 74 01/24/2022 0846     The 10-year ASCVD risk score (Arnett DK, et al., 2019) is: 16.3%   Values used to calculate the score:     Age: 73 years     Sex: Male     Is Non-Hispanic African American: Yes     Diabetic: Yes     Tobacco smoker: Yes     Systolic Blood Pressure: 128 mmHg     Is BP treated: No     HDL Cholesterol: 65 mg/dL     Total Cholesterol: 154 mg/dL  OSA/Obesity: -CPAP nightly, working on lifestyle change -Losing weight with the Mounjaro    Health Maintenance: -Blood work UTD -Colon cancer screening: Cologuard 4/23 negative    Past Medical History:  Diagnosis Date   Acquired trigger finger 08/02/2017   Decreased libido    Heart murmur    Obesity    Sleep apnea in adult    Sprain of metacarpophalangeal joint 08/02/2017   Type 2 diabetes mellitus (HCC) 04/28/2016    Past Surgical History:  Procedure Laterality Date   CARPAL TUNNEL RELEASE     CARPAL TUNNEL RELEASE Right    approximately 10 years ago    Family History  Problem Relation Age of Onset   Hypertension Mother    Sleep disorder Father    Hypertension Brother     Social History   Socioeconomic History   Marital status: Married    Spouse name: Jodeen Munch   Number of children: 2   Years of education: Not on file   Highest education level: Some college,  no degree  Occupational History   Not on file  Tobacco Use   Smoking status: Every Day    Current packs/day: 0.50    Average packs/day: 0.5 packs/day for 24.0 years (12.0 ttl pk-yrs)    Types: Cigarettes   Smokeless tobacco: Never   Tobacco comments:    most of years less than half a pack per day   Vaping Use   Vaping status: Never Used  Substance and Sexual Activity   Alcohol use: Yes    Alcohol/week: 0.0 standard drinks of alcohol    Comment: occasionally   Drug use: No   Sexual activity: Yes    Partners: Female    Birth control/protection: None  Other Topics Concern   Not on file  Social History Narrative   Works at [employer]   Social Drivers of  [employer]: Low Risk  (03/04/2018)   Overall Financial Resource Strain (CARDIA)    Difficulty of Paying Living Expenses: Not hard at all  Food Insecurity: No Food Insecurity (03/04/2018)   Hunger Vital Sign    Worried About Running Out of Food in the Last Year: Never true    Ran Out of Food in the Last Year: Never true  Transportation Needs: No Transportation Needs (03/04/2018)   PRAPARE - Administrator, Civil Service (Medical): No    Lack of Transportation (Non-Medical): No  Physical Activity: Inactive (03/04/2018)   Exercise Vital Sign    Days of Exercise per Week: 0 days    Minutes of Exercise per Session: 0 min  Stress: Stress Concern Present (03/04/2018)   [employer] of Occupational Health - Occupational Stress Questionnaire    Feeling of Stress : To some extent  Social Connections: Somewhat Isolated (03/04/2018)   Social Connection and Isolation Panel [NHANES]    Frequency of Communication with Friends and Family: More than three times a week    Frequency of Social Gatherings with Friends and Family: Never    Attends Religious Services: Never    Database administrator or Organizations: No    Attends Banker Meetings: Never    Marital Status: Married  Catering manager Violence: Not At Risk (03/04/2018)   Humiliation, Afraid, Rape, and Kick questionnaire    Fear of Current or Ex-Partner: No    Emotionally Abused: No    Physically Abused: No    Sexually Abused: No    Outpatient Medications Prior to Visit  Medication Sig Dispense Refill   aspirin  EC 81 MG tablet Take 1 tablet (81 mg total) by mouth daily. 90 tablet 0   empagliflozin  (JARDIANCE ) 10 MG TABS tablet Take 1 tablet (10 mg total) by mouth daily before breakfast. 90 tablet 1   rosuvastatin  (CRESTOR ) 10 MG tablet Take 1 tablet (10 mg total) by mouth daily. 90 tablet 1   tirzepatide  (MOUNJARO ) 2.5 MG/0.5ML Pen Inject 2.5 mg into the skin once a week. 12 mL 0   No  facility-administered medications prior to visit.    No Known Allergies  ROS Review of Systems  Musculoskeletal:  Positive for myalgias.  All other systems reviewed and are negative.     Objective:    Physical Exam Constitutional:      Appearance: Normal appearance.  HENT:     Head: Normocephalic and atraumatic.  Eyes:     Conjunctiva/sclera: Conjunctivae normal.  Cardiovascular:     Rate and Rhythm: Normal rate and regular rhythm.  Pulmonary:  Effort: Pulmonary effort is normal.     Breath sounds: Normal breath sounds.  Musculoskeletal:     Comments: Pain and swelling over PIP of left third digit   Skin:    General: Skin is warm and dry.  Neurological:     General: No focal deficit present.     Mental Status: He is alert. Mental status is at baseline.  Psychiatric:        Mood and Affect: Mood normal.        Behavior: Behavior normal.     BP 128/72 (Cuff Size: Large)   Pulse 79   Temp 98.6 F (37 C) (Oral)   Resp 16   Ht 5\' 6"  (1.676 m)   Wt 216 lb 8 oz (98.2 kg)   SpO2 99%   BMI 34.94 kg/m  Wt Readings from Last 3 Encounters:  06/12/23 216 lb 8 oz (98.2 kg)  03/09/23 222 lb 8 oz (100.9 kg)  12/12/22 225 lb (102.1 kg)     Health Maintenance Due  Topic Date Due   OPHTHALMOLOGY EXAM  02/17/2023   Diabetic kidney evaluation - eGFR measurement  05/10/2023    There are no preventive care reminders to display for this patient.  Lab Results  Component Value Date   TSH 2.03 04/28/2016   Lab Results  Component Value Date   WBC 7.3 05/10/2022   HGB 13.3 05/10/2022   HCT 40.6 05/10/2022   MCV 87.9 05/10/2022   PLT 171 05/10/2022   Lab Results  Component Value Date   NA 138 05/10/2022   K 5.7 (H) 05/10/2022   CO2 25 05/10/2022   GLUCOSE 230 (H) 05/10/2022   BUN 18 05/10/2022   CREATININE 0.87 05/10/2022   BILITOT 0.5 05/10/2022   ALKPHOS 91 04/28/2016   AST 15 05/10/2022   ALT 20 05/10/2022   PROT 6.9 05/10/2022   ALBUMIN 4.2 04/28/2016    CALCIUM  9.9 05/10/2022   EGFR 104 05/10/2022   Lab Results  Component Value Date   CHOL 154 01/24/2022   Lab Results  Component Value Date   HDL 65 01/24/2022   Lab Results  Component Value Date   LDLCALC 74 01/24/2022   Lab Results  Component Value Date   TRIG 73 01/24/2022   Lab Results  Component Value Date   CHOLHDL 2.4 01/24/2022   Lab Results  Component Value Date   HGBA1C 6.3 (A) 06/12/2023      Assessment & Plan:   Assessment & Plan Finger Injury Injury with difficulty bending and acute pain. Possible joint displacement or tendonitis. X-ray needed to rule out fracture. - Order x-ray of the finger. - Advise ice application and rest. - Avoid excessive use of the affected hand.  Type 2 Diabetes Mellitus Well-controlled with A1c of 6.3%. Current regimen effective with weight loss and improved glycemic control. - Continue Mounjaro  at 2.5 mg. - Continue Jardiance  10 mg. - Monitor A1c and renal function in six months.  Hyperlipidemia Managed with medication. Due for cholesterol labs in the fall. - Continue current cholesterol medication. - Perform cholesterol labs in six months.  - POCT HgB A1C - tirzepatide  (MOUNJARO ) 2.5 MG/0.5ML Pen; Inject 2.5 mg into the skin once a week.  Dispense: 12 mL; Refill: 0 - empagliflozin  (JARDIANCE ) 10 MG TABS tablet; Take 1 tablet (10 mg total) by mouth daily before breakfast.  Dispense: 90 tablet; Refill: 1 - rosuvastatin  (CRESTOR ) 10 MG tablet; Take 1 tablet (10 mg total) by mouth daily.  Dispense:  90 tablet; Refill: 1 - DG Hand Complete Left; Future   Follow-up: Return in about 6 months (around 12/12/2023).    Rockney Cid, DO

## 2023-10-24 ENCOUNTER — Encounter: Payer: Self-pay | Admitting: Internal Medicine

## 2023-11-24 ENCOUNTER — Emergency Department

## 2023-11-24 ENCOUNTER — Encounter: Payer: Self-pay | Admitting: Emergency Medicine

## 2023-11-24 ENCOUNTER — Emergency Department
Admission: EM | Admit: 2023-11-24 | Discharge: 2023-11-24 | Disposition: A | Discharge status: Home / Self Care | Attending: Emergency Medicine | Admitting: Emergency Medicine

## 2023-11-24 ENCOUNTER — Other Ambulatory Visit: Payer: Self-pay

## 2023-11-24 DIAGNOSIS — W010XXA Fall on same level from slipping, tripping and stumbling without subsequent striking against object, initial encounter: Secondary | ICD-10-CM | POA: Diagnosis not present

## 2023-11-24 DIAGNOSIS — E119 Type 2 diabetes mellitus without complications: Secondary | ICD-10-CM | POA: Insufficient documentation

## 2023-11-24 DIAGNOSIS — M25562 Pain in left knee: Secondary | ICD-10-CM | POA: Insufficient documentation

## 2023-11-24 DIAGNOSIS — M25462 Effusion, left knee: Secondary | ICD-10-CM | POA: Diagnosis not present

## 2023-11-24 DIAGNOSIS — M1712 Unilateral primary osteoarthritis, left knee: Secondary | ICD-10-CM | POA: Diagnosis not present

## 2023-11-24 DIAGNOSIS — W19XXXA Unspecified fall, initial encounter: Secondary | ICD-10-CM

## 2023-11-24 MED ORDER — IBUPROFEN 800 MG PO TABS
800.0000 mg | ORAL_TABLET | Freq: Once | ORAL | Status: AC
  Administered 2023-11-24: 800 mg via ORAL
  Filled 2023-11-24: qty 1

## 2023-11-24 MED ORDER — IBUPROFEN 800 MG PO TABS: 800.0000 mg | ORAL_TABLET | Freq: Three times a day (TID) | ORAL | 0 refills | Status: DC | PRN

## 2023-11-24 MED ORDER — OXYCODONE-ACETAMINOPHEN 5-325 MG PO TABS: 1.0000 | ORAL_TABLET | Freq: Three times a day (TID) | ORAL | 0 refills | Status: AC | PRN

## 2023-11-24 MED ORDER — OXYCODONE-ACETAMINOPHEN 5-325 MG PO TABS
1.0000 | ORAL_TABLET | Freq: Once | ORAL | Status: AC
  Administered 2023-11-24: 1 via ORAL
  Filled 2023-11-24: qty 1

## 2023-11-24 NOTE — Discharge Instructions (Addendum)
 You are seen in the emergency department for acute left knee pain after a fall today.  Fortunately you do not have an acute fracture or dislocation of your knee.  I do believe that you may have a ligamentous or meniscal injury that warrants further workup with orthopedics outpatient.  Please follow-up with the orthopedic doctor listed Alvin Green) and give their office a call to schedule an appointment.  You may take the medication as prescribed.  Take the ibuprofen  as regularly scheduled.  If you are not having any symptom relief, you may take the Percocet as prescribed.  Please do not drive, work, make any legal binding decisions, drink alcohol, climb on ladders or heights while taking the Percocet.  Elevate and ice your knee for 20 to 30 minutes at a time at home.  Use the crutches and weightbearing as tolerated until reevaluated by orthopedics.

## 2023-11-24 NOTE — ED Provider Notes (Signed)
 [facility] Provider Note    Event Date/Time   First MD Initiated Contact with Patient 11/24/23 1622     (approximate)   History   Fall   HPI  Alvin Green is a 53 y.o. male  with a past medical history of type 2 diabetes, vitamin D  deficiency, OSA presents to the emergency department with left anteromedial knee pain that started after a mechanical fall today.  Patient states he was walking outside in the woods and he slipped on some wet leaves and did a split.  He is unsure exactly how he landed on his knee or if he heard a pop afterwards.  Reports his knee has been giving way after the accident.  He denies hitting his head, loss of consciousness, any other injuries, hip or ankle pain, fever, chills, erythema.  He did not have any knee pain prior to today's injury.  Patient was able to bear weight on it after the incident.  He did drive himself and ambulate into the emergency department until they placed him in a wheelchair on arrival.  Physical Exam   Triage Vital Signs: ED Triage Vitals [11/24/23 1608]  Encounter Vitals Group     BP (!) 156/67     Girls Systolic BP Percentile      Girls Diastolic BP Percentile      Boys Systolic BP Percentile      Boys Diastolic BP Percentile      Pulse Rate 65     Resp 18     Temp 98 F (36.7 C)     Temp Source Oral     SpO2 100 %     Weight 212 lb (96.2 kg)     Height 5' 6 (1.676 m)     Head Circumference      Peak Flow      Pain Score 8     Pain Loc      Pain Education      Exclude from Growth Chart     Most recent vital signs: Vitals:   11/24/23 1608  BP: (!) 156/67  Pulse: 65  Resp: 18  Temp: 98 F (36.7 C)  SpO2: 100%    General: Awake, in no acute distress. Appears stated age. Head: Normocephalic, atraumatic. Neck: Supple. CV: Good peripheral perfusion. DP and PT pulses 2+ b/l. Respiratory:Normal respiratory effort.  No respiratory distress.  GI: Soft, non-distended. MSK:  Limited exam due to patient's pain--unable to accurately assess ligaments and menisci. TTP along anteromedial aspect of the left knee with some mild swelling. Full ROM of b/l hips and ankles. Patient can flex knee when prompted but it causes him immense pain. No palpable evidence of Baker's cyst. Skin:Warm, dry, intact. No rashes, lesions, or ecchymosis. No cyanosis or pallor. No overlying erythema. Neurological: A&Ox4 to person, place, time, and situation. Sensation intact. Strength symmetric. No focal deficits.   ED Results / Procedures / Treatments   Labs (all labs ordered are listed, but only abnormal results are displayed) Labs Reviewed - No data to display   EKG     RADIOLOGY X ray left knee  FINDINGS: No acute fracture or dislocation. The bones are well mineralized. Mild arthritic changes of the knee. No joint effusion. Calcification of the distal quadriceps tendon. The soft tissues are unremarkable.   IMPRESSION: 1. No acute fracture or dislocation. 2. Mild arthritic changes.   PROCEDURES:  Critical Care performed: No   Procedures   MEDICATIONS ORDERED IN ED:  Medications  oxyCODONE -acetaminophen  (PERCOCET/ROXICET) 5-325 MG per tablet 1 tablet (1 tablet Oral Given 11/24/23 1712)  ibuprofen  (ADVIL ) tablet 800 mg (800 mg Oral Given 11/24/23 1712)     IMPRESSION / MDM / ASSESSMENT AND PLAN / ED COURSE  I reviewed the triage vital signs and the nursing notes.                              Differential diagnosis includes, but is not limited to, knee sprain, knee dislocation, knee fracture, patellar tendinitis,  Patient's presentation is most consistent with acute complicated illness / injury requiring diagnostic workup.  Patient here after mechanical fall today with left knee injury.  GCS 15 and well-appearing. Left knee x-ray ordered in triage. I independently viewed the x-ray and radiologist's report.  I agree with the radiologist's report that there are no  acute findings.  Given his level of pain, suspect ligamentous versus meniscal injury.  Given a dose of Percocet and ibuprofen  here in the emergency department.  PDMP reviewed prior to prescribing Percocet.  Will send prescriptions for Percocet and ibuprofen  as well.  Patient given crutches with weightbearing as tolerated precautions.  Will have him follow-up with orthopedics outpatient following today's visit.  Work note provided.  The patient may return to the emergency department for any new, worsening, or concerning symptoms. Patient was given the opportunity to ask questions; all questions were answered. Emergency department return precautions were discussed with the patient.  Patient is in agreement to the treatment plan.  Patient is stable for discharge.    FINAL CLINICAL IMPRESSION(S) / ED DIAGNOSES   Final diagnoses:  Left medial knee pain  Left anterior knee pain  Fall, initial encounter     Rx / DC Orders   ED Discharge Orders          Ordered    ibuprofen  (ADVIL ) 800 MG tablet  Every 8 hours PRN        11/24/23 1723    oxyCODONE -acetaminophen  (PERCOCET) 5-325 MG tablet  Every 8 hours PRN        11/24/23 1723             Note:  This document was prepared using Dragon voice recognition software and may include unintentional dictation errors.     Sheron Salm, PA-C 11/24/23 1806    Dorothyann Drivers, MD 11/24/23 [phone]

## 2023-11-24 NOTE — ED Triage Notes (Signed)
 Patient arrives POV c/o left knee pain after fall. States he was walking and slipped on some wet leaves. Denies hitting head.

## 2023-11-26 ENCOUNTER — Ambulatory Visit: Payer: Self-pay

## 2023-11-26 NOTE — Telephone Encounter (Signed)
 FYI Only or Action Required?: Action required by provider: referral request.  Patient was last seen in primary care on 06/12/2023 by Bernardo Fend, DO.  Called Nurse Triage reporting Fall.  Symptoms began 2 days ago.  Interventions attempted: Other: went to the ED.  Symptoms are: gradually improving.  Triage Disposition: See PCP Within 2 Weeks  Patient/caregiver understands and will follow disposition?: Yes      Copied from CRM [id number]. Topic: Clinical - Red Word Triage >> Nov 26, 2023 11:13 AM Alvin Green wrote: Red Word that prompted transfer to Nurse Triage:  Patient fell over the weekend, knee pain. >> Nov 26, 2023 11:16 AM Alvin B wrote: Patient is looking to get a ortho referral.        Reason for Disposition  [1] Fall AND [2] went to emergency department for evaluation or treatment  Answer Assessment - Initial Assessment Questions Patient was seen in the ED 2 days ago and has an upcoming appointment on 12/9. He has declined an earlier appointment and would like to get a referral to an orthopedist that would be within his insurance network. Please advise.      1. MECHANISM: How did the fall happen?     [place] 2. DOMESTIC VIOLENCE AND ELDER ABUSE SCREENING: Did you fall because someone pushed you or tried to hurt you? If Yes, ask: Are you safe now?     No 3. ONSET: When did the fall happen? (e.g., minutes, hours, or days ago)     2 days ago  4. LOCATION: What part of the body hit the ground? (e.g., back, buttocks, head, hips, knees, hands, head, stomach)     Left knee 5. INJURY: Did you hurt (injure) yourself when you fell? If Yes, ask: What did you injure? Tell me more about this? (e.g., body area; type of injury; pain severity)     Left knee 6. PAIN: Is there any pain? If Yes, ask: How bad is the pain? (e.g., Scale 0-10; or none, mild,      7/10 7. SIZE: For cuts, bruises, or swelling, ask: How large is it? (e.g.,  inches or centimeters)      Swelling 9. OTHER SYMPTOMS: Do you have any other symptoms? (e.g., dizziness, fever, weakness; new-onset or worsening).      No 10. CAUSE: What do you think caused the fall (or falling)? (e.g., dizzy spell, tripped)       Slipped on wet leaves/glass  Protocols used: Falls and [facility]

## 2023-11-26 NOTE — Telephone Encounter (Signed)
 Pt was able to get in with [facility] clinic

## 2023-11-27 DIAGNOSIS — Z6834 Body mass index (BMI) 34.0-34.9, adult: Secondary | ICD-10-CM | POA: Diagnosis not present

## 2023-11-27 DIAGNOSIS — S76112A Strain of left quadriceps muscle, fascia and tendon, initial encounter: Secondary | ICD-10-CM | POA: Diagnosis not present

## 2023-11-28 ENCOUNTER — Other Ambulatory Visit: Payer: Self-pay | Admitting: Orthopedic Surgery

## 2023-11-28 DIAGNOSIS — S76112A Strain of left quadriceps muscle, fascia and tendon, initial encounter: Secondary | ICD-10-CM

## 2023-11-30 ENCOUNTER — Ambulatory Visit
Admission: RE | Admit: 2023-11-30 | Discharge: 2023-11-30 | Disposition: A | Discharge status: Home / Self Care | Source: Ambulatory Visit | Attending: Orthopedic Surgery | Admitting: Orthopedic Surgery

## 2023-11-30 DIAGNOSIS — M25462 Effusion, left knee: Secondary | ICD-10-CM | POA: Diagnosis not present

## 2023-11-30 DIAGNOSIS — S76112A Strain of left quadriceps muscle, fascia and tendon, initial encounter: Secondary | ICD-10-CM | POA: Insufficient documentation

## 2023-12-04 ENCOUNTER — Encounter: Payer: Self-pay | Admitting: Orthopedic Surgery

## 2023-12-04 NOTE — Anesthesia Preprocedure Evaluation (Addendum)
 Anesthesia Evaluation  Patient identified by MRN, date of birth, ID band Patient awake    Reviewed: Allergy & Precautions, H&P , NPO status , Patient's Chart, lab work & pertinent test results  Airway Mallampati: III  TM Distance: >3 FB    Comment: Well trimmed beard, mustache Dental no notable dental hx. (+) Teeth Intact   Pulmonary sleep apnea , Current Smoker          Cardiovascular + Valvular Problems/Murmurs      Neuro/Psych  PSYCHIATRIC DISORDERS      negative neurological ROS     GI/Hepatic negative GI ROS, Neg liver ROS,,,  Endo/Other  diabetes    Renal/GU negative Renal ROS  negative genitourinary   Musculoskeletal negative musculoskeletal ROS (+)    Abdominal   Peds negative pediatric ROS (+)  Hematology negative hematology ROS (+)   Anesthesia Other Findings Took Mounjaro  at least one week ago  Heart murmur Obesity BMI 34.22 Type 2 diabetes mellitus Sleep apnea in adult  Reproductive/Obstetrics negative OB ROS                              Anesthesia Physical Anesthesia Plan  ASA: 2  Anesthesia Plan: General   Post-op Pain Management:    Induction: Intravenous  PONV Risk Score and Plan:   Airway Management Planned: Natural Airway, LMA and Simple Face Mask  Additional Equipment:   Intra-op Plan:   Post-operative Plan: Extubation in OR  Informed Consent: I have reviewed the patients History and Physical, chart, labs and discussed the procedure including the risks, benefits and alternatives for the proposed anesthesia with the patient or authorized representative who has indicated his/her understanding and acceptance.     Dental Advisory Given  Plan Discussed with: Anesthesiologist, CRNA and Surgeon  Anesthesia Plan Comments: (Patient consented for risks of anesthesia including but not limited to:  - adverse reactions to medications - risk of airway  placement if required - damage to eyes, teeth, lips or other oral mucosa - nerve damage due to positioning  - sore throat or hoarseness - Damage to heart, brain, nerves, lungs, other parts of body or loss of life  Patient voiced understanding and assent.)         Anesthesia Quick Evaluation

## 2023-12-06 ENCOUNTER — Other Ambulatory Visit: Payer: Self-pay

## 2023-12-06 ENCOUNTER — Encounter: Payer: Self-pay | Admitting: Orthopedic Surgery

## 2023-12-06 ENCOUNTER — Ambulatory Visit: Payer: Self-pay | Admitting: Anesthesiology

## 2023-12-06 ENCOUNTER — Encounter: Admission: RE | Disposition: A | Payer: Self-pay | Source: Home / Self Care | Attending: Orthopedic Surgery

## 2023-12-06 ENCOUNTER — Ambulatory Visit
Admission: RE | Admit: 2023-12-06 | Discharge: 2023-12-06 | Disposition: A | Attending: Orthopedic Surgery | Admitting: Orthopedic Surgery

## 2023-12-06 HISTORY — PX: QUADRICEPS TENDON REPAIR: SHX756

## 2023-12-06 LAB — GLUCOSE, CAPILLARY: Glucose-Capillary: 119 mg/dL — ABNORMAL HIGH (ref 70–99)

## 2023-12-06 SURGERY — REPAIR, TENDON, QUADRICEPS
Anesthesia: General | Site: Knee | Laterality: Left

## 2023-12-06 MED ORDER — PHENYLEPHRINE HCL (PRESSORS) 10 MG/ML IV SOLN
INTRAVENOUS | Status: DC | PRN
  Administered 2023-12-06: 80 ug via INTRAVENOUS

## 2023-12-06 MED ORDER — DEXAMETHASONE SODIUM PHOSPHATE 4 MG/ML IJ SOLN
INTRAMUSCULAR | Status: AC
  Filled 2023-12-06: qty 1

## 2023-12-06 MED ORDER — DEXAMETHASONE SOD PHOSPHATE PF 10 MG/ML IJ SOLN
INTRAMUSCULAR | Status: DC | PRN
  Administered 2023-12-06: 10 mg via PERINEURAL

## 2023-12-06 MED ORDER — LIDOCAINE HCL (PF) 2 % IJ SOLN
INTRAMUSCULAR | Status: AC
  Filled 2023-12-06: qty 5

## 2023-12-06 MED ORDER — DEXMEDETOMIDINE HCL IN NACL 80 MCG/20ML IV SOLN
INTRAVENOUS | Status: AC
  Filled 2023-12-06: qty 20

## 2023-12-06 MED ORDER — ONDANSETRON 4 MG PO TBDP: 4.0000 mg | ORAL_TABLET | Freq: Three times a day (TID) | ORAL | 0 refills | Status: AC | PRN

## 2023-12-06 MED ORDER — LIDOCAINE HCL 2 % IJ SOLN
INTRAMUSCULAR | Status: AC
  Filled 2023-12-06: qty 2

## 2023-12-06 MED ORDER — PROPOFOL 10 MG/ML IV BOLUS
INTRAVENOUS | Status: DC | PRN
  Administered 2023-12-06: 200 mg via INTRAVENOUS
  Administered 2023-12-06: 50 mg via INTRAVENOUS

## 2023-12-06 MED ORDER — HYDROMORPHONE HCL 1 MG/ML IJ SOLN
INTRAMUSCULAR | Status: AC
  Filled 2023-12-06: qty 0.5

## 2023-12-06 MED ORDER — FENTANYL CITRATE (PF) 50 MCG/ML IJ SOSY
50.0000 ug | PREFILLED_SYRINGE | INTRAMUSCULAR | Status: AC | PRN
  Administered 2023-12-06 (×2): 50 ug via INTRAVENOUS

## 2023-12-06 MED ORDER — ACETAMINOPHEN 500 MG PO TABS: 1000.0000 mg | ORAL_TABLET | Freq: Three times a day (TID) | ORAL | 2 refills | Status: AC

## 2023-12-06 MED ORDER — SODIUM CHLORIDE 0.9 % IV SOLN: INTRAVENOUS | Status: DC

## 2023-12-06 MED ORDER — LIDOCAINE HCL (CARDIAC) PF 100 MG/5ML IV SOSY
PREFILLED_SYRINGE | INTRAVENOUS | Status: DC | PRN
  Administered 2023-12-06: 100 mg via INTRATRACHEAL

## 2023-12-06 MED ORDER — ASPIRIN 325 MG PO TBEC: 325.0000 mg | DELAYED_RELEASE_TABLET | Freq: Every day | ORAL | 0 refills | Status: AC

## 2023-12-06 MED ORDER — DEXMEDETOMIDINE HCL IN NACL 400 MCG/100ML IV SOLN
INTRAVENOUS | Status: DC | PRN
  Administered 2023-12-06 (×5): 4 ug via INTRAVENOUS

## 2023-12-06 MED ORDER — PROPOFOL 10 MG/ML IV BOLUS
INTRAVENOUS | Status: AC
  Filled 2023-12-06: qty 40

## 2023-12-06 MED ORDER — FENTANYL CITRATE (PF) 100 MCG/2ML IJ SOLN
INTRAMUSCULAR | Status: DC | PRN
  Administered 2023-12-06: 100 ug via INTRAVENOUS
  Administered 2023-12-06 (×2): 25 ug via INTRAVENOUS

## 2023-12-06 MED ORDER — PHENYLEPHRINE 80 MCG/ML (10ML) SYRINGE FOR IV PUSH (FOR BLOOD PRESSURE SUPPORT)
PREFILLED_SYRINGE | INTRAVENOUS | Status: AC
  Filled 2023-12-06: qty 10

## 2023-12-06 MED ORDER — MIDAZOLAM HCL 5 MG/5ML IJ SOLN
INTRAMUSCULAR | Status: DC | PRN
  Administered 2023-12-06: 2 mg via INTRAVENOUS

## 2023-12-06 MED ORDER — OXYCODONE HCL 5 MG PO TABS: 5.0000 mg | ORAL_TABLET | ORAL | 0 refills | Status: AC | PRN

## 2023-12-06 MED ORDER — DEXAMETHASONE SODIUM PHOSPHATE 4 MG/ML IJ SOLN
INTRAMUSCULAR | Status: DC | PRN
  Administered 2023-12-06: 4 mg via INTRAVENOUS

## 2023-12-06 MED ORDER — BUPIVACAINE HCL (PF) 0.5 % IJ SOLN
INTRAMUSCULAR | Status: AC
  Filled 2023-12-06: qty 30

## 2023-12-06 MED ORDER — BUPIVACAINE LIPOSOME 1.3 % IJ SUSP
INTRAMUSCULAR | Status: AC
  Filled 2023-12-06: qty 10

## 2023-12-06 MED ORDER — CEFAZOLIN SODIUM-DEXTROSE 2-4 GM/100ML-% IV SOLN
2.0000 g | INTRAVENOUS | Status: AC
  Administered 2023-12-06: 2 g via INTRAVENOUS

## 2023-12-06 MED ORDER — OXYCODONE HCL 5 MG PO TABS
ORAL_TABLET | ORAL | Status: AC
  Filled 2023-12-06: qty 2

## 2023-12-06 MED ORDER — FENTANYL CITRATE (PF) 100 MCG/2ML IJ SOLN
INTRAMUSCULAR | Status: AC
  Filled 2023-12-06: qty 2

## 2023-12-06 MED ORDER — LACTATED RINGERS IV SOLN: INTRAVENOUS | Status: DC

## 2023-12-06 MED ORDER — LIDOCAINE HCL (PF) 1 % IJ SOLN
INTRAMUSCULAR | Status: DC | PRN
  Administered 2023-12-06: .5 mL via SUBCUTANEOUS

## 2023-12-06 MED ORDER — HYDROMORPHONE HCL 1 MG/ML IJ SOLN
0.5000 mg | Freq: Once | INTRAMUSCULAR | Status: AC
  Administered 2023-12-06: 0.5 mg via INTRAVENOUS

## 2023-12-06 MED ORDER — OXYCODONE HCL 5 MG PO TABS
10.0000 mg | ORAL_TABLET | Freq: Once | ORAL | Status: AC
  Administered 2023-12-06: 10 mg via ORAL

## 2023-12-06 MED ORDER — BUPIVACAINE LIPOSOME 1.3 % IJ SUSP
INTRAMUSCULAR | Status: DC | PRN
  Administered 2023-12-06: 10 mL

## 2023-12-06 MED ORDER — ACETAMINOPHEN 10 MG/ML IV SOLN
INTRAVENOUS | Status: DC | PRN
  Administered 2023-12-06: 1000 mg via INTRAVENOUS

## 2023-12-06 MED ORDER — BUPIVACAINE HCL (PF) 0.5 % IJ SOLN
INTRAMUSCULAR | Status: DC | PRN
  Administered 2023-12-06: 20 mL via PERINEURAL

## 2023-12-06 MED ORDER — BUPIVACAINE HCL (PF) 0.5 % IJ SOLN
INTRAMUSCULAR | Status: DC | PRN
  Administered 2023-12-06: 10 mL

## 2023-12-06 MED ORDER — MIDAZOLAM HCL 2 MG/2ML IJ SOLN
INTRAMUSCULAR | Status: AC
  Filled 2023-12-06: qty 2

## 2023-12-06 MED ORDER — CEFAZOLIN SODIUM-DEXTROSE 2-3 GM-%(50ML) IV SOLR
INTRAVENOUS | Status: AC
  Filled 2023-12-06: qty 50

## 2023-12-06 MED ORDER — ONDANSETRON HCL 4 MG/2ML IJ SOLN
INTRAMUSCULAR | Status: AC
  Filled 2023-12-06: qty 2

## 2023-12-06 MED ORDER — ACETAMINOPHEN 10 MG/ML IV SOLN
INTRAVENOUS | Status: AC
  Filled 2023-12-06: qty 100

## 2023-12-06 SURGICAL SUPPLY — 32 items
BLADE SURG SZ10 CARB STEEL (BLADE) ×2 IMPLANT
BNDG COHESIVE 4X5 TAN ST LF (GAUZE/BANDAGES/DRESSINGS) ×1 IMPLANT
BNDG ELASTIC 6X5.8 VLCR NS LF (GAUZE/BANDAGES/DRESSINGS) ×1 IMPLANT
BNDG ESMARK 6X12 TAN STRL LF (GAUZE/BANDAGES/DRESSINGS) ×1 IMPLANT
CANISTER SUCT 1200ML W/VALVE (MISCELLANEOUS) ×1 IMPLANT
CHLORAPREP W/TINT 26 (MISCELLANEOUS) ×1 IMPLANT
COOLER ICEMAN CLASSIC (MISCELLANEOUS) ×1 IMPLANT
DRAPE INCISE IOBAN 66X45 STRL (DRAPES) ×1 IMPLANT
DRAPE U 60X70 (DRAPES) ×1 IMPLANT
ELECTRODE REM PT RTRN 9FT ADLT (ELECTROSURGICAL) ×1 IMPLANT
GAUZE SPONGE 4X4 12PLY STRL (GAUZE/BANDAGES/DRESSINGS) ×1 IMPLANT
GAUZE XEROFORM 1X8 LF (GAUZE/BANDAGES/DRESSINGS) ×1 IMPLANT
GLOVE SRG 8 PF TXTR STRL LF DI (GLOVE) ×1 IMPLANT
GLOVE SURG SYN 7.5 PF PI (GLOVE) ×1 IMPLANT
GOWN STRL REUS W/ TWL LRG LVL3 (GOWN DISPOSABLE) ×1 IMPLANT
GOWN STRL REUS W/ TWL XL LVL3 (GOWN DISPOSABLE) ×1 IMPLANT
KIT TURNOVER KIT A (KITS) ×1 IMPLANT
NDL HD SCORPION MEGA LOADER (NEEDLE) IMPLANT
NDL REVERSE CUT 1/2 CRC (NEEDLE) ×1 IMPLANT
NS IRRIG 1000ML POUR BTL (IV SOLUTION) ×1 IMPLANT
PACK EXTREMITY ARMC (MISCELLANEOUS) ×1 IMPLANT
PAD ABD DERMACEA PRESS 5X9 (GAUZE/BANDAGES/DRESSINGS) ×1 IMPLANT
PAD COLD UNI WRAP-ON (PAD) ×1 IMPLANT
PADDING CAST BLEND 6X4 STRL (MISCELLANEOUS) ×1 IMPLANT
PENCIL SMOKE EVACUATOR (MISCELLANEOUS) IMPLANT
STAPLER SKIN PROX 35W (STAPLE) ×1 IMPLANT
STOCKINETTE IMPERVIOUS 9X36 MD (GAUZE/BANDAGES/DRESSINGS) ×1 IMPLANT
SUT VIC AB 0 CT1 36 (SUTURE) ×2 IMPLANT
SUT VIC AB 2-0 CT1 TAPERPNT 27 (SUTURE) ×2 IMPLANT
SUT VIC AB 2-0 CT2 27 (SUTURE) ×2 IMPLANT
SUT VIC AB 3-0 SH 27X BRD (SUTURE) ×2 IMPLANT
SYSTEM INTERNAL BRACE KNEE (Miscellaneous) IMPLANT

## 2023-12-06 NOTE — H&P (Signed)
 Paper H&P to be scanned into permanent record. H&P reviewed. No significant changes noted.

## 2023-12-06 NOTE — Anesthesia Postprocedure Evaluation (Signed)
 Anesthesia Post Note  Patient: Carlin Fall Demond  Procedure(s) Performed: REPAIR, TENDON, QUADRICEPS (Left: Knee)  Patient location during evaluation: PACU Anesthesia Type: General Level of consciousness: awake and alert Pain management: pain level controlled Vital Signs Assessment: post-procedure vital signs reviewed and stable Respiratory status: spontaneous breathing, nonlabored ventilation, respiratory function stable and patient connected to nasal cannula oxygen Cardiovascular status: blood pressure returned to baseline and stable Postop Assessment: no apparent nausea or vomiting Anesthetic complications: no   No notable events documented.   Last Vitals:  Vitals:   12/06/23 1115  BP: (!) 137/51  Pulse: (!) 57  Resp: 20  Temp: 37.1 C  SpO2: 99%    Last Pain:  Vitals:   12/06/23 1115  TempSrc: Temporal  PainSc: 6                  Menelik Mcfarren C Yogesh Cominsky

## 2023-12-06 NOTE — Anesthesia Procedure Notes (Signed)
 Procedure Name: LMA Insertion Date/Time: 12/06/2023 1:08 PM  Performed by: Carman Auxier, CRNAPre-anesthesia Checklist: Patient identified, Emergency Drugs available, Suction available, Patient being monitored and Timeout performed Patient Re-evaluated:Patient Re-evaluated prior to induction Oxygen Delivery Method: Circle system utilized Preoxygenation: Pre-oxygenation with 100% oxygen Induction Type: IV induction LMA: LMA inserted LMA Size: 5.0 Tube type: Oral Number of attempts: 1 Placement Confirmation: positive ETCO2, CO2 detector and breath sounds checked- equal and bilateral Tube secured with: Tape Dental Injury: Teeth and Oropharynx as per pre-operative assessment

## 2023-12-06 NOTE — Anesthesia Procedure Notes (Addendum)
 Anesthesia Regional Block: Adductor canal block   Pre-Anesthetic Checklist: , timeout performed,  Correct Patient, Correct Site, Correct Laterality,  Correct Procedure, Correct Position, site marked,  Risks and benefits discussed,  Surgical consent,  Pre-op evaluation,  At surgeon's request and post-op pain management  Laterality: Lower and Left  Prep: chloraprep       Needles:  Injection technique: Single-shot  Needle Type: Echogenic Needle     Needle Length: 9cm  Needle Gauge: 21     Additional Needles:   Procedures:,,,, ultrasound used (permanent image in chart),,    Narrative:  Start time: 12/06/2023 3:28 PM End time: 12/06/2023 3:33 PM Injection made incrementally with aspirations every 5 mL.  Performed by: Personally  Anesthesiologist: Ola Donny BROCKS, MD  Additional Notes: Patient consented for risk and benefits of nerve block including but not limited to nerve damage, failed block, bleeding and infection.  Patient voiced understanding.   Functioning IV was confirmed and monitors were applied.  Timeout done prior to procedure  and prior to any sedation being given to the patient.  Patient confirmed procedure site prior to any sedation given to the patient.  A 50mm 22ga Stimuplex needle was used. Sterile prep,hand hygiene and sterile gloves were used.  Minimal sedation used for procedure.  No paresthesia endorsed by patient during the procedure.  Negative aspiration and negative test dose prior to incremental administration of local anesthetic. The patient tolerated the procedure well with no immediate complications. Block placed postop because Patient still in 10/10 pain postop, despite surgeon administering 10 ml marcaine  0.5% and exparel  10 ml, as well as oxycodone  10 mg PO, fentanyl  100 mcg IV, dilaudid  0.5 mg IV. Discussed w/surgeon and patient and placed left adductor canal block for postop pain. 1543, patient reports pain is a lot better! Facial muscles relaxed now,  not tense as face was prior to block. Patient much more comfortable now.

## 2023-12-06 NOTE — Discharge Instructions (Signed)
 Post-Op Instructions - Quadriceps Tendon Repair  1. Bracing or crutches: You will be provided with a long brace (from hip to ankle) and crutches.  2. Ice: You will be provided with a device [facility]) that allows you to ice the affected area effectively.   3. Showering: Incision must remain dry for 5 days. Afterwards, you may shower and gently pat incision dry. NO submerging wound for 4 weeks.   4. Driving: You will be given specific driving precautions at discharge. Plan on not driving for at least one week for left knee surgery, and 4-6 weeks for right knee surgery if you are restricted due to the brace and knee motion. Please note that you are advised NOT to drive while taking narcotic pain medications as you may be impaired and unsafe to drive.  5. Activity: Weight bearing: Weight bearing as tolerated with brace locked in extension. Bending the knee is limited and will be guided by the physical therapist. Elevate knee above heart level as much as possible for one week. Avoid standing more than 5 minutes (consecutively) for the first week. No exercise involving the knee until cleared by the surgeon or physical therapist.  Avoid long distance travel for 4 weeks.  6. Medications: - You have been provided a prescription for narcotic pain medicine. After surgery, take 1-2 narcotic tablets every 4 hours if needed for severe pain.  - A prescription for anti-nausea medication will be provided in case the narcotic medicine causes nausea - take 1 tablet every 6 hours only if nauseated.  - Take aspirin  325mg  daily for 4 weeks to prevent blood clots.  -Take tylenol  1000 every 8 hours for pain.  May stop tylenol  3 days after surgery or when you are having minimal pain. -DO NOT TAKE IBUPROFEN, ALEVE or OTHER NSAIDs as they may interfere with healing.    If you are taking prescription medication for anxiety, depression, insomnia, muscle spasm, chronic pain, or for attention deficit disorder, you are  advised that you are at a higher risk of adverse effects with use of narcotics post-op, including narcotic addiction/dependence, depressed breathing, death. If you use non-prescribed substances: alcohol, marijuana, cocaine, heroin, methamphetamines, etc., you are at a higher risk of adverse effects with use of narcotics post-op, including narcotic addiction/dependence, depressed breathing, death. You are advised that taking > 50 morphine milligram equivalents (MME) of narcotic pain medication per day results in twice the risk of overdose or death. For your prescription provided: oxycodone  5 mg - taking more than 6 tablets per day would result in > 50 morphine milligram equivalents (MME) of narcotic pain medication. Be advised that we will prescribe narcotics short-term, for acute post-operative pain, only 3 weeks for major operations such as knee repair/reconstruction surgeries.   7. Bandages: The physical therapist should change the bandages at the first post-op appointment. If needed, the dressing supplies have been provided to you.  8. Physical Therapy: 1-2 times per week starting within ~1 week of surgery. You have been provided an order for physical therapy today and should have your first appointment scheduled. The therapist will provide home exercises.  9. Work or School: For most, but not all procedures, we advise staying out of work or school for at least 1 to 2 weeks in order to recover from the stress of surgery and to allow time for healing and swelling control. More labor intensive jobs may require additional time off. If you need a work or school note this can be provided.  10. Post-Op Appointments: Your first post-op appointment will be in approximately 2 weeks time. Please double check if this will be at the [facility] facility (Tuesdays and Thursdays) or [place] facility (Wednesdays).    If you find that they have not been scheduled please call the Orthopaedic Appointment front desk at  [phone].

## 2023-12-06 NOTE — Op Note (Signed)
 DATE OF SURGERY: 12/06/2023  PRE-OP DIAGNOSIS: Left Quadricpes Tendon Rupture   POST-OP DIAGNOSIS: Left Quadriceps Tendon Rupture  PROCEDURES: Left Quadriceps Tendon Repair  SURGEON: Earnestine HILARIO Blanch, MD  ASSISTANT(S): none  ANESTHESIA: Gen  TOTAL IV FLUIDS: see anesthesia record  ESTIMATED BLOOD LOSS: 5cc  TOURNIQUET TIME: 24 min (dropped tourniquet in middle of surgery due to venous bleeding)  DRAINS:  none  SPECIMENS: None.  IMPLANTS: Arthrex 4.3mm SwiveLock anchors x 2  COMPLICATIONS: None apparent.  INDICATIONS: Alvin Green is a 53 y.o. male who sustained a quadriceps tendon rupture after a fall. Physical exam was notable for an obvious gap in the quadriceps tendon just superior to the patella.  The patient was unable to perform a straight leg raise.  Imaging confirmed complete rupture of the quadriceps tendon. After discussion of risks, benefits, and alternatives to surgery, the patient elected to proceed with quadriceps tendon repair.  DETAILS OF PROCEDURE: Carlin Fall Jaskulski was met in the preoperative holding area and informed consent was verified.  The patient was brought to the operating room and placed supine on the table. Anesthesia was administered. Leg was prescrubbed with Hibiclens and alcohol, prepped with ChloraPrep and draped in the usual sterile fashion. The patient was given preoperative IV antibiotics within 30 minutes of the start of the case, and a surgical time-out occurred. A well-padded tourniquet was placed.   The leg was elevated, exsanguinated with an Esmarch bandage and tourniquet inflated to . A midline incision was created on the knee from the superior pole of the patella to approximately 8 cm superiorly. The retinacular layer was developed, medial and lateral, in line with the skin incision. At that point, an obvious tear of the quadriceps tendon was notable. Medial and lateral retinacular tears were also identified. Edges of the  quadriceps tendon were debrided to healthy tendon. The superior patella was prepared by removing soft tissue proximally and any small bony fragments. I then created a small trough with a rongeur. Two SutureTape sutures were then placed in the proximal quadriceps tendon with medial Krackow stitch and a lateral Krackow stitch. A pin was in the superior patella at the medial 1/3 junction of the patella. Similarly, another pin was passed at the lateral 1/3 junction of the patella. Both pins were overdrilled, and then tapped twice in preparation of insertion of a 4.73mm SwiveLock anchor. The wound was thoroughly irrigated at this point. The medial set of each of the SutureTape sutures were loaded into a 4.41mm SwiveLock anchor and the anchor was inserted with the appropriate amount of tension with the leg in full extension. This process was repeated for the lateral sutures. This construct appropriately reduced the quadriceps tendon to the superior pole of the patella. The FiberWire sutures from each anchor were used to oversew the quadriceps tendon to reinforce the repair. The medial and lateral retinacular layers were closed with 0 Vicryl suture in a figure of 8 fashion. The wound was irrigated again.  The patient could reach ~40 degrees of flexion before there was a significant increase in tension.  There was no gap formation between the superior patella and quadriceps tendon. Appropriate hemostasis was achieved with bovie electrocautery. Local anesthetic was injected. 2-0 Vicryl was used to close the subdermal layer tissue.  Staples were used to close skin.  Sterile dressing, PolarCare, and hinged knee brace locked at 0 degrees were applied. Instrument, sponge, and needle counts were correct prior to wound closure and at the conclusion of the case.  The patient was then awakened from anesthesia without complication.   POST-OPERATIVE PLAN: - ASA 325mg /day x for DVT ppx - WBAT on operative lower extremity with brace  locked in extension x 6 weeks - PT/OT to start on POD #3-4 - Follow-up with me in approximately 2 weeks   REHAB PROTOCOL    GOALS:  ?A/AAROM 90-100 degrees by 6 weeks, 0-110 degrees by week 8, 0-130 degreesby week 10, and 0-135 degrees by week 12.  Week 1-4  No active ROM knee extension.  ?PROM knee ext to 0 degrees ?AROM/AAROM knee flexion - very gently - Safe range as determined in Operative note (amount of tension-free repair). 0-40 ?Gradually unlock brace for sitting as PROM knee flexion improves  Exercises:  ?Ankle pumps ?Patellar mobilizations ?Hamstring stretch sitting ?Gastroc stretch with towel ?Heelslides ?Quad sets - may add E-stim for re-education at 2-3 weeks upon MD approval ?Patellar mobilization - all directions. ?SLR all directions, active assistive flexion- start at 3rd post-op week - do notallow lag - use e-stim as needed after 2-3 weeks. If unable to achieve fullextension, perform SLR in knee immobilizer  Week 5: Gradually increase A/AAROM knee flexion  Exercises:  ?Submaximal multi-angle isometrics (30-50% only) ?Continue knee flexion ROM - rocking chair at home ?Active SLR 4 way - no weight for flexion - watch for extensor lag - increaseresistance for hip abduction, adduction, and extension.  Add aquatic therapy if available. Move slowly so water is assistive and not resistive  Aquatic therapy exercises:  ?With knee submerged in water, knee dangling at 80-90 degrees - slowly activelyextend knee to 0 degrees. ?Water walking in chest deep water ?SLR 4 way in the water with knee straight ?Knee flexion in water  Week 6-8:  Brace - unlock for sitting to 90 degrees at 6 weeks. If quad control sufficient at 8 weeks unlock brace 0-90 degrees for ambulation with bilateral axillary crutches and gradually open brace as ROM improves. Progress to ambulation at 8 weeks with no crutches as quadriceps strength allows. D/C crutches and brace at 8-12 weeks depending on  patient's quadriceps control. Emphasize frequent ROM exercises  Goals - Gradually increase P/A/AAROM during weeks 6-8   Exercises:  ?Total gym semi squats level 3-4 ?Gradually increase weight on all SLR, if no lag present ?Week 6 - bike (begin with rocking and progress to full revolutions) ?Week 6 - Closed chain terminal knee extension with theraband ?Week 6 - SAQ (AROM) ?Week 7 - LAQ (AROM) ?Week 8 - SAQ (gradually increase resistance) ?Week 8 - LAQ (gradually increase resistance) ?Week 8 - weight shifts ?Week 8 - balance master and/or BAPS - with bilateral LE weight bearing ?Week 8 - cones  Week 9-10:  Exercises:  ?Total gym level 5-6 ?Bilateral leg press - concentric only - no significant load work until 12 weeks.  ?Weight shift on minitramp ?Toe rises ?Treadmill - Concentrate on pattern with eccentric knee control Week 11-16:  Exercises:  ?Leg press - Gradually increase weight and begin unilateral leg press at week 12 ?Wall squats ?Balance activities: unilateral stance eyes open and closed, balance master ?Standing minisquats ?Step-ups - start concentrically, 2" to start and progress as tolerated ?Week 16 - lunges ?Week 16 - stairclimber/elliptical machine  CRITERIA TO START RUNNING PROGRAM  ?Patient is able to walk with a normal gait pattern for at least 20 minutes withoutsymptoms and performs ADL's painfree ?ROM is equal to uninvolved side, or at least 0-125 degrees ?Hamstring and quadriceps strength is 70% of the  uninvolved side isokinetically ?Patient without pain, edema, crepitus, or giving-way   ?Wall squats ?Balance activities: unilateral stance eyes open and closed, balance master ?Standing minisquats ?Step-ups - start concentrically, 2" to start and progress as tolerated ?Week 16 - lunges ?Week 16 - stairclimber/elliptical machine  CRITERIA TO START RUNNING PROGRAM  ?Patient is able to walk with a normal gait pattern for at least 20 minutes  withoutsymptoms and performs ADL's painfree ?ROM is equal to uninvolved side, or at least 0-125 degrees ?Hamstring and quadriceps strength is 70% of the uninvolved side isokinetically ?Patient without pain, edema, crepitus, or giving-way

## 2023-12-06 NOTE — Transfer of Care (Signed)
 Immediate Anesthesia Transfer of Care Note  Patient: Alvin Green  Procedure(s) Performed: REPAIR, TENDON, QUADRICEPS (Left: Knee)  Patient Location: PACU  Anesthesia Type: General  Level of Consciousness: awake, alert  and patient cooperative  Airway and Oxygen Therapy: Patient Spontanous Breathing and Patient connected to supplemental oxygen  Post-op Assessment: Post-op Vital signs reviewed, Patient's Cardiovascular Status Stable, Respiratory Function Stable, Patent Airway and No signs of Nausea or vomiting  Post-op Vital Signs: Reviewed and stable  Complications: No notable events documented.

## 2023-12-06 NOTE — Addendum Note (Signed)
 Addendum  created 12/06/23 1600 by Ola Donny BROCKS, MD   Child order released for a procedure order, Clinical Note Signed, Intraprocedure Blocks edited, Intraprocedure Meds edited, SmartForm saved

## 2023-12-07 ENCOUNTER — Encounter: Payer: Self-pay | Admitting: Orthopedic Surgery

## 2023-12-11 ENCOUNTER — Ambulatory Visit: Admitting: Internal Medicine
